# Patient Record
Sex: Female | Born: 1935 | Race: White | Hispanic: No | Smoking: Never smoker
Health system: Southern US, Community
[De-identification: ages and names within clinical notes are randomized; demographics above are authoritative.]

## PROBLEM LIST (undated history)

## (undated) DIAGNOSIS — R079 Chest pain, unspecified: Secondary | ICD-10-CM

## (undated) DIAGNOSIS — G4700 Insomnia, unspecified: Secondary | ICD-10-CM

## (undated) DIAGNOSIS — F0390 Unspecified dementia without behavioral disturbance: Secondary | ICD-10-CM

## (undated) DIAGNOSIS — E785 Hyperlipidemia, unspecified: Secondary | ICD-10-CM

## (undated) DIAGNOSIS — E559 Vitamin D deficiency, unspecified: Secondary | ICD-10-CM

## (undated) DIAGNOSIS — K635 Polyp of colon: Secondary | ICD-10-CM

## (undated) HISTORY — DX: Hyperlipidemia, unspecified: E78.5

## (undated) HISTORY — PX: CATARACT EXTRACTION: SUR2

## (undated) HISTORY — DX: Vitamin D deficiency, unspecified: E55.9

## (undated) HISTORY — DX: Insomnia, unspecified: G47.00

## (undated) HISTORY — DX: Unspecified dementia, unspecified severity, without behavioral disturbance, psychotic disturbance, mood disturbance, and anxiety: F03.90

## (undated) HISTORY — DX: Chest pain, unspecified: R07.9

## (undated) HISTORY — DX: Polyp of colon: K63.5

## (undated) HISTORY — PX: EYE SURGERY: SHX253

---

## 2017-08-11 ENCOUNTER — Other Ambulatory Visit: Payer: Self-pay | Admitting: Family Medicine

## 2017-08-11 ENCOUNTER — Encounter: Payer: Self-pay | Admitting: Gastroenterology

## 2017-08-11 DIAGNOSIS — Z1231 Encounter for screening mammogram for malignant neoplasm of breast: Secondary | ICD-10-CM

## 2017-08-11 DIAGNOSIS — E2839 Other primary ovarian failure: Secondary | ICD-10-CM

## 2017-08-17 ENCOUNTER — Observation Stay (HOSPITAL_COMMUNITY)
Admission: EM | Admit: 2017-08-17 | Discharge: 2017-08-19 | Disposition: A | Discharge status: Skilled Nursing Facility | Payer: Medicare Other | Attending: Internal Medicine | Admitting: Internal Medicine

## 2017-08-17 ENCOUNTER — Encounter (HOSPITAL_COMMUNITY): Payer: Self-pay | Admitting: Emergency Medicine

## 2017-08-17 ENCOUNTER — Emergency Department (HOSPITAL_COMMUNITY): Payer: Medicare Other

## 2017-08-17 DIAGNOSIS — R531 Weakness: Principal | ICD-10-CM

## 2017-08-17 DIAGNOSIS — D649 Anemia, unspecified: Secondary | ICD-10-CM | POA: Diagnosis present

## 2017-08-17 DIAGNOSIS — R410 Disorientation, unspecified: Secondary | ICD-10-CM

## 2017-08-17 DIAGNOSIS — W19XXXA Unspecified fall, initial encounter: Secondary | ICD-10-CM

## 2017-08-17 DIAGNOSIS — I700 Atherosclerosis of aorta: Secondary | ICD-10-CM | POA: Diagnosis not present

## 2017-08-17 DIAGNOSIS — G9340 Encephalopathy, unspecified: Secondary | ICD-10-CM | POA: Diagnosis present
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
## 2017-08-18 ENCOUNTER — Emergency Department (HOSPITAL_COMMUNITY): Payer: Medicare Other

## 2017-08-18 ENCOUNTER — Observation Stay (HOSPITAL_COMMUNITY): Payer: Medicare Other

## 2017-08-18 ENCOUNTER — Encounter (HOSPITAL_COMMUNITY): Payer: Self-pay | Admitting: Internal Medicine

## 2017-08-18 DIAGNOSIS — G9340 Encephalopathy, unspecified: Secondary | ICD-10-CM | POA: Diagnosis not present

## 2017-08-18 DIAGNOSIS — R531 Weakness: Secondary | ICD-10-CM | POA: Diagnosis not present

## 2017-08-18 DIAGNOSIS — D649 Anemia, unspecified: Secondary | ICD-10-CM

## 2017-08-18 LAB — RETICULOCYTES
RBC.: 3.27 MIL/uL — ABNORMAL LOW (ref 3.87–5.11)
RETIC COUNT ABSOLUTE: 49.1 10*3/uL (ref 19.0–186.0)
RETIC CT PCT: 1.5 % (ref 0.4–3.1)

## 2017-08-18 LAB — CBC
HCT: 32.1 % — ABNORMAL LOW (ref 36.0–46.0)
HCT: 34.1 % — ABNORMAL LOW (ref 36.0–46.0)
Hemoglobin: 10.5 g/dL — ABNORMAL LOW (ref 12.0–15.0)
Hemoglobin: 11 g/dL — ABNORMAL LOW (ref 12.0–15.0)
MCH: 32.1 pg (ref 26.0–34.0)
MCH: 32.3 pg (ref 26.0–34.0)
MCHC: 32.3 g/dL (ref 30.0–36.0)
MCHC: 32.7 g/dL (ref 30.0–36.0)
MCV: 100 fL (ref 78.0–100.0)
MCV: 98.2 fL (ref 78.0–100.0)
PLATELETS: 205 10*3/uL (ref 150–400)
PLATELETS: 214 10*3/uL (ref 150–400)
RBC: 3.27 MIL/uL — AB (ref 3.87–5.11)
RBC: 3.41 MIL/uL — ABNORMAL LOW (ref 3.87–5.11)
RDW: 13.7 % (ref 11.5–15.5)
RDW: 13.9 % (ref 11.5–15.5)
WBC: 5.5 10*3/uL (ref 4.0–10.5)
WBC: 7.8 10*3/uL (ref 4.0–10.5)

## 2017-08-18 LAB — I-STAT CG4 LACTIC ACID, ED: Lactic Acid, Venous: 1.29 mmol/L (ref 0.5–1.9)

## 2017-08-18 LAB — COMPREHENSIVE METABOLIC PANEL
ALK PHOS: 77 U/L (ref 38–126)
ALT: 11 U/L — AB (ref 14–54)
AST: 17 U/L (ref 15–41)
Albumin: 3.9 g/dL (ref 3.5–5.0)
Anion gap: 8 (ref 5–15)
BILIRUBIN TOTAL: 0.1 mg/dL — AB (ref 0.3–1.2)
BUN: 19 mg/dL (ref 6–20)
CO2: 28 mmol/L (ref 22–32)
CREATININE: 0.64 mg/dL (ref 0.44–1.00)
Calcium: 10.7 mg/dL — ABNORMAL HIGH (ref 8.9–10.3)
Chloride: 105 mmol/L (ref 101–111)
GFR calc Af Amer: >60 mL/min (ref >60)
Glucose, Bld: 99 mg/dL (ref 65–99)
Potassium: 3.7 mmol/L (ref 3.5–5.1)
Sodium: 141 mmol/L (ref 135–145)
TOTAL PROTEIN: 7.4 g/dL (ref 6.5–8.1)

## 2017-08-18 LAB — URINALYSIS, ROUTINE W REFLEX MICROSCOPIC
Bacteria, UA: NONE SEEN
Bilirubin Urine: NEGATIVE
Glucose, UA: NEGATIVE mg/dL
HGB URINE DIPSTICK: NEGATIVE
Ketones, ur: NEGATIVE mg/dL
Nitrite: NEGATIVE
Protein, ur: NEGATIVE mg/dL
SPECIFIC GRAVITY, URINE: 1.013 (ref 1.005–1.030)
pH: 6 (ref 5.0–8.0)

## 2017-08-18 LAB — I-STAT TROPONIN, ED: Troponin i, poc: 0.01 ng/mL (ref 0.00–0.08)

## 2017-08-18 LAB — CREATININE, SERUM
Creatinine, Ser: 0.64 mg/dL (ref 0.44–1.00)
GFR calc non Af Amer: >60 mL/min (ref >60)

## 2017-08-18 LAB — FOLATE: Folate: 7.4 ng/mL (ref >5.9)

## 2017-08-18 LAB — CORTISOL: Cortisol, Plasma: 8.7 ug/dL

## 2017-08-18 LAB — FERRITIN: FERRITIN: 80 ng/mL (ref 11–307)

## 2017-08-18 LAB — VITAMIN B12: Vitamin B-12: 207 pg/mL (ref 180–914)

## 2017-08-18 LAB — AMMONIA: Ammonia: 17 umol/L (ref 9–35)

## 2017-08-18 LAB — TSH: TSH: 2.063 u[IU]/mL (ref 0.350–4.500)

## 2017-08-18 LAB — IRON AND TIBC
Iron: 84 ug/dL (ref 28–170)
Saturation Ratios: 33 % — ABNORMAL HIGH (ref 10.4–31.8)
TIBC: 258 ug/dL (ref 250–450)
UIBC: 174 ug/dL

## 2017-08-18 LAB — CK: Total CK: 31 U/L — ABNORMAL LOW (ref 38–234)

## 2017-08-18 LAB — RPR: RPR Ser Ql: NONREACTIVE

## 2017-08-18 LAB — TROPONIN I: Troponin I: <0.03 ng/mL (ref <0.03)

## 2017-08-18 MED ORDER — DOCUSATE SODIUM 100 MG PO CAPS
100.0000 mg | ORAL_CAPSULE | Freq: Every day | ORAL | Status: DC
  Administered 2017-08-18 – 2017-08-19 (×2): 100 mg via ORAL
  Filled 2017-08-18 (×2): qty 1

## 2017-08-18 MED ORDER — ACETAMINOPHEN 325 MG PO TABS: 650.0000 mg | ORAL_TABLET | Freq: Four times a day (QID) | ORAL | Status: DC | PRN

## 2017-08-18 MED ORDER — LORAZEPAM 2 MG/ML IJ SOLN
0.5000 mg | Freq: Once | INTRAMUSCULAR | Status: AC
  Administered 2017-08-18: 0.5 mg via INTRAVENOUS
  Filled 2017-08-18: qty 1

## 2017-08-18 MED ORDER — ONDANSETRON HCL 4 MG/2ML IJ SOLN: 4.0000 mg | Freq: Four times a day (QID) | INTRAMUSCULAR | Status: DC | PRN

## 2017-08-18 MED ORDER — ONDANSETRON HCL 4 MG PO TABS: 4.0000 mg | ORAL_TABLET | Freq: Four times a day (QID) | ORAL | Status: DC | PRN

## 2017-08-18 MED ORDER — ENOXAPARIN SODIUM 40 MG/0.4ML ~~LOC~~ SOLN
40.0000 mg | SUBCUTANEOUS | Status: DC
  Administered 2017-08-18 – 2017-08-19 (×2): 40 mg via SUBCUTANEOUS
  Filled 2017-08-18 (×2): qty 0.4

## 2017-08-18 MED ORDER — ENSURE ENLIVE PO LIQD
237.0000 mL | Freq: Two times a day (BID) | ORAL | Status: DC
  Administered 2017-08-18 – 2017-08-19 (×4): 237 mL via ORAL

## 2017-08-18 MED ORDER — ACETAMINOPHEN 650 MG RE SUPP: 650.0000 mg | Freq: Four times a day (QID) | RECTAL | Status: DC | PRN

## 2017-08-18 NOTE — ED Notes (Addendum)
Call Patients sister Delray Alt with any updates or questions : [phone]

## 2017-08-18 NOTE — ED Notes (Signed)
Call report to Melissa [phone] at [phone]
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
## 2017-08-18 NOTE — Progress Notes (Signed)
Pt seen and examined at bedside, admitted after midnight, please see earlier admission note by Dr. Toniann Fail. Pt admitted for evaluation of progressive weakness. VS and blood work stable this AM. PT eval pending.   Debbora Presto, MD  [facility] Pager [phone]  If 7PM-7AM, please contact night-coverage [email or web address] Password [email or web address]

## 2017-08-18 NOTE — Progress Notes (Signed)
Initial Nutrition Assessment  DOCUMENTATION CODES:   Severe malnutrition in context of social or environmental circumstances  INTERVENTION:  Continue Ensure Enlive po BID, each supplement provides 350 kcal and 20 grams of protein  NUTRITION DIAGNOSIS:   Malnutrition (severe) related to social / environmental circumstances (lived alone/did not prepare meals) as evidenced by severe depletion of muscle mass, severe depletion of body fat.  GOAL:   Patient will meet greater than or equal to 90% of their needs  MONITOR:   PO intake, Supplement acceptance, Weight trends, I & O's  REASON FOR ASSESSMENT:   Malnutrition Screening Tool    ASSESSMENT:   Pt with no significant PMH presents with weakness and confusion.   Spoke with pt, pt's sister, and pt's niece at bedside. Per report pt has had good po intake since admission. Per chart pt consumed 100% of meal at 1110. Per pt's sister, pt lived alone and would not prepare meals for herself r/t fatigue. Pt recently moved and is now watched by family.   [name] sister and niece report pt has had 25 lb weight loss in < 1 month, 19% loss, significant for time frame. Family reports they do not know a UBW for pt as they have not seen her in a few years. Pt reports a UBW ~150 lbs.  Pt reports enjoying the Ensure supplement and is willing to continue.  RD discussed the importance of adequate nutrition and ways to incorporate more calories and protein in her diet.   Labs reviewed Medications reviewed; Colace  Nutrition focused physical exam completed. Findings include severe muscle depletion, severe fat depletion and unable to assess edema.   Diet Order:  Diet regular Room service appropriate? Yes; Fluid consistency: Thin  Skin:  Reviewed, no issues  Last BM:  08/16/17   Height:   Ht Readings from Last 1 Encounters:  08/18/17  (1.676 m)    Weight:   Wt Readings from Last 1 Encounters:  08/18/17 109 lb 5.6 oz (49.6 kg)     Ideal Body Weight:  59 kg  BMI:  Body mass index is 17.65 kg/m.  Estimated Nutritional Needs:   Kcal:  1480-1640  Protein:  70-85 grams  Fluid:  >/= 1.5 L/d  EDUCATION NEEDS:   Education needs addressed  Fransisca Kaufmann, MS, RDN, LDN 08/18/2017 2:05 PM

## 2017-08-18 NOTE — Progress Notes (Signed)
Pt premedicated for MRI. SRP,RN

## 2017-08-18 NOTE — Progress Notes (Signed)
MD notified to order Premed prior to MRI procedure. Order received. SRP, RN

## 2017-08-19 ENCOUNTER — Encounter (HOSPITAL_COMMUNITY): Payer: Self-pay

## 2017-08-19 DIAGNOSIS — R531 Weakness: Secondary | ICD-10-CM | POA: Diagnosis not present

## 2017-08-19 LAB — CBC
HCT: 32.3 % — ABNORMAL LOW (ref 36.0–46.0)
Hemoglobin: 10.4 g/dL — ABNORMAL LOW (ref 12.0–15.0)
MCH: 32 pg (ref 26.0–34.0)
MCHC: 32.2 g/dL (ref 30.0–36.0)
MCV: 99.4 fL (ref 78.0–100.0)
PLATELETS: 205 10*3/uL (ref 150–400)
RBC: 3.25 MIL/uL — ABNORMAL LOW (ref 3.87–5.11)
RDW: 14 % (ref 11.5–15.5)
WBC: 4.9 10*3/uL (ref 4.0–10.5)

## 2017-08-19 LAB — BASIC METABOLIC PANEL
Anion gap: 4 — ABNORMAL LOW (ref 5–15)
BUN: 14 mg/dL (ref 6–20)
CALCIUM: 10.5 mg/dL — AB (ref 8.9–10.3)
CHLORIDE: 105 mmol/L (ref 101–111)
CO2: 33 mmol/L — AB (ref 22–32)
CREATININE: 0.5 mg/dL (ref 0.44–1.00)
GFR calc non Af Amer: >60 mL/min (ref >60)
Glucose, Bld: 92 mg/dL (ref 65–99)
Potassium: 4.2 mmol/L (ref 3.5–5.1)
SODIUM: 142 mmol/L (ref 135–145)

## 2017-08-19 NOTE — Progress Notes (Signed)
Resumed care of this patient from prior nurse and agree with previous assessment.  

## 2017-08-19 NOTE — Clinical Social Work Placement (Signed)
Pt discharging today - will admit to [facility]. Report [phone] Pt will transport via EMS- completed medical necessity form and arranged transportation. [name] sister and niece at bedside- aware and agreeable to plan- going to facility to complete admission paperwork while pt awaits transportation.  All information provided to facility via the HUB- see below for placement details.   CLINICAL SOCIAL WORK PLACEMENT  NOTE  Date:  08/19/2017  Patient Details  Name: Tamara Velez MRN: [id number] Date of Birth: Jul 22, 1931  Clinical Social Work is seeking post-discharge placement for this patient at the Skilled  Nursing Facility level of care (*CSW will initial, date and re-position this form in  chart as items are completed):  Yes   Patient/family provided with [facility] Clinical Social Work Department's list of facilities offering this level of care within the geographic area requested by the patient (or if unable, by the patient's family).  Yes   Patient/family informed of their freedom to choose among providers that offer the needed level of care, that participate in Medicare, Medicaid or managed care program needed by the patient, have an available bed and are willing to accept the patient.  Yes   Patient/family informed of [facility]'s ownership interest in [facility] and [facility], as well as of the fact that they are under no obligation to receive care at these facilities.  PASRR submitted to EDS on 08/19/17     PASRR number received on 08/19/17     Existing PASRR number confirmed on       FL2 transmitted to all facilities in geographic area requested by pt/family on 08/19/17     FL2 transmitted to all facilities within larger geographic area on       Patient informed that his/her managed care company has contracts with or will negotiate with certain facilities, including the following:        Yes   Patient/family informed of bed offers received.  Patient  chooses bed at [facility]     Physician recommends and patient chooses bed at [facility]    Patient to be transferred to [facility] on 08/19/17.  Patient to be transferred to facility by       Patient family notified on 08/19/17 of transfer.  Name of family member notified:  sister [name]     PHYSICIAN       Additional Comment:    _______________________________________________ [name], LCSW 08/19/2017, 5:07 PM  [phone]
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
## 2017-08-19 NOTE — Care Management Obs Status (Signed)
MEDICARE OBSERVATION STATUS NOTIFICATION   Patient Details  Name: Tamara Velez MRN: [id number] Date of Birth: 1931-11-04   Medicare Observation Status Notification Given:  Yes    Geni Bers, RN 08/19/2017, 4:56 PM
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
## 2017-08-19 NOTE — Discharge Instructions (Signed)
Fall Prevention in Hospitals, Adult WHAT ARE SOME SAFETY TIPS FOR PREVENTING FALLS? If you or a loved one has to stay in the hospital, talk with the health care providers about the risk of falling. Find out which medicines or treatments can cause dizziness or affect balance. Make a plan with the health care providers to prevent falls. The plan may include these points:  Ask for help moving around, especially after surgery or when feeling unwell.  Have support available when getting up and moving around.  Wear nonskid footwear.  Use the safety straps on wheelchairs.  Ask for help to get objects that are out of reach.  Wear eyeglasses.  Remove all clutter from the floor and the sides of the bed.  Keep equipment and wires securely out of the way.  Keep the bed locked in the low position.  Keep the side rails up on the bed.  Keep the nurse call button within reach.  Keep the door open when no one else is in the room.  Have someone stay in the hospital with you or your loved one.  Ask for a bed alarm if you are not able to stay with your loved one who is at risk for getting up without help.  Ask if sleeping pills or other medicines that alter mental status are necessary. WHAT INCREASES THE RISK FOR FALLS? Certain conditions and treatments may increase a patient's risk for falls in a hospital. These include:  Being in an unfamiliar environment.  Being on bed rest.  Having surgery.  Taking certain medicines, such as sleeping pills.  Having tubes in place, such as IV lines or catheters. Additional risk factors for falls in a hospital include:  Having vision problems.  Having a change in thinking, feeling, or behavior (altered mental status).  Having trouble with balance.  Needing to use the toilet frequently.  Having fallen in the past three months.  Having low blood pressure when standing up quickly (orthostatic hypotension). WHAT DOES THE HOSPITAL STAFF DO TO HELP  PREVENT ME OR MY LOVED ONE FROM FALLING? Hospitals have systems in place to prevent falls and accidents. Talk with the hospital staff about:  Doing an assessment to discuss fall risks and create a personalized plan to prevent falls.  Checking in regularly to see if help is needed for moving around and to assess any changes in fall risk.  Knowing where the nurse call button is and how to use it. Use this to call a nursing care provider any time.  Keeping personal items within reach. This includes eyeglasses, phones, and other electronic devices.  Following general safety guidelines when moving around.  Keeping the area around the bed free from clutter.  Removing unnecessary equipment or tubes to reduce the risk of tripping.  Using safety equipment, such as:  Walkers, crutches, and other walking devices for support.  Safety rails on the bed.  Safety straps in the bed.  A bed that can be lowered and locked to prevent movement.  Handrails in the bathroom.  Nonskid socks and shoes.  Locking mechanisms to secure equipment in place.  Lifting and transfer equipment. WHAT CAN I DO TO HELP PREVENT A FALL?  Talk with health care providers about fall prevention.  Have a personalized fall prevention plan in place.  Do not try to move around if you feel off balance or ill.  Change position slowly.  Sit on the side of the bed before standing up.  Sit down and call   for help if you feel dizzy or unsteady when standing.  Keep the hospital room clear of clutter. WHEN SHOULD I ASK FOR HELP? Ask for help whenever you:  Are not sure if you are able to move around safely.  Feel dizzy or unsteady.  Are not comfortable helping your loved one move around or use the bathroom. If you or a loved one falls, tell the hospital staff. This is important. This information is not intended to replace advice given to you by your health care provider. Make sure you discuss any questions you have  with your health care provider. Document Released: 10/30/2000 Document Revised: 03/31/2016 Document Reviewed: 08/15/2015 Elsevier Interactive Patient Education  2017 Elsevier Inc.  

## 2017-08-19 NOTE — NC FL2 (Signed)
  [place] MEDICAID FL2 LEVEL OF CARE SCREENING TOOL     IDENTIFICATION  Patient Name: Mykelle Cockerell Birthdate: 04/10/1931 Sex: female Admission Date (Current Location): 08/17/2017  [facility] and [place] Number:  Producer, television/film/video and Address:  [facility],  [address] [place], [place] [address]      Provider Number: [id number]  Attending Physician Name and Address:  Dorothea Ogle, MD  Relative Name and Phone Number:       Current Level of Care: Hospital Recommended Level of Care: Skilled Nursing Facility Prior Approval Number:    Date Approved/Denied:   PASRR Number: [id number]  Discharge Plan: SNF    Current Diagnoses: Patient Active Problem List   Diagnosis Date Noted  . Generalized weakness 08/18/2017  . Normochromic normocytic anemia 08/18/2017  . Acute encephalopathy 08/18/2017  . Weakness 08/18/2017    Orientation RESPIRATION BLADDER Height & Weight     Self, Time, Situation, Place  O2 (2L) Incontinent Weight: 109 lb 5.6 oz (49.6 kg) Height:   (167.6 cm)  BEHAVIORAL SYMPTOMS/MOOD NEUROLOGICAL BOWEL NUTRITION STATUS      Continent Diet (regular diet, thin fluid consistency)  AMBULATORY STATUS COMMUNICATION OF NEEDS Skin   Extensive Assist Verbally Normal                       Personal Care Assistance Level of Assistance  Bathing, Feeding, Dressing Bathing Assistance: Limited assistance Feeding assistance: Independent Dressing Assistance: Maximum assistance     Functional Limitations Info  Sight, Hearing, Speech Sight Info: Adequate Hearing Info: Adequate Speech Info: Adequate    SPECIAL CARE FACTORS FREQUENCY  PT (By licensed PT), OT (By licensed OT)     PT Frequency: 5x OT Frequency: 5x            Contractures Contractures Info: Not present    Additional Factors Info  Code Status, Allergies Code Status Info: full code Allergies Info: penicillins           Current Medications (08/19/2017):  This is the  current hospital active medication list Current Facility-Administered Medications  Medication Dose Route Frequency Provider Last Rate Last Dose  . acetaminophen (TYLENOL) tablet 650 mg  650 mg Oral Q6H PRN Eduard Clos, MD       Or  . acetaminophen (TYLENOL) suppository 650 mg  650 mg Rectal Q6H PRN Eduard Clos, MD      . docusate sodium (COLACE) capsule 100 mg  100 mg Oral Daily Eduard Clos, MD   100 mg at 08/19/17 1022  . enoxaparin (LOVENOX) injection 40 mg  40 mg Subcutaneous Q24H Eduard Clos, MD   40 mg at 08/19/17 1023  . feeding supplement (ENSURE ENLIVE) (ENSURE ENLIVE) liquid 237 mL  237 mL Oral BID BM Dorothea Ogle, MD   237 mL at 08/19/17 1027  . ondansetron (ZOFRAN) tablet 4 mg  4 mg Oral Q6H PRN Eduard Clos, MD       Or  . ondansetron [facility]) injection 4 mg  4 mg Intravenous Q6H PRN Eduard Clos, MD         Discharge Medications: Please see discharge summary for a list of discharge medications.  Relevant Imaging Results:  Relevant Lab Results:   Additional Information SS#[id number]  [name], LCSW

## 2017-08-19 NOTE — Progress Notes (Signed)
Report given to RN at [place], Leavy Cella.

## 2017-08-19 NOTE — Progress Notes (Signed)
PTAR at bedside to transport pt to [facility]. All belongings sent with pt. VSS.
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
## 2017-08-20 ENCOUNTER — Other Ambulatory Visit: Payer: Self-pay | Admitting: Family Medicine

## 2017-08-20 DIAGNOSIS — R42 Dizziness and giddiness: Secondary | ICD-10-CM

## 2017-09-02 ENCOUNTER — Other Ambulatory Visit: Payer: Self-pay | Admitting: *Deleted

## 2017-09-02 ENCOUNTER — Encounter: Payer: Self-pay | Admitting: *Deleted

## 2017-09-03 ENCOUNTER — Ambulatory Visit: Payer: Medicare Other | Admitting: Emergency Medicine

## 2017-09-08 ENCOUNTER — Ambulatory Visit: Payer: Self-pay

## 2017-09-08 ENCOUNTER — Other Ambulatory Visit: Payer: Self-pay

## 2017-10-05 ENCOUNTER — Telehealth: Payer: Self-pay | Admitting: Gastroenterology

## 2017-10-05 ENCOUNTER — Ambulatory Visit: Payer: Self-pay | Admitting: Gastroenterology

## 2017-10-05 NOTE — Telephone Encounter (Signed)
No charge this time. 

## 2017-10-05 NOTE — Telephone Encounter (Signed)
Do you want to charge? 

## 2017-10-06 ENCOUNTER — Inpatient Hospital Stay
Admission: RE | Admit: 2017-10-06 | Discharge: 2017-10-06 | Disposition: A | Discharge status: Home / Self Care | Payer: Medicare Other | Source: Ambulatory Visit | Attending: Family Medicine | Admitting: Family Medicine

## 2017-10-06 ENCOUNTER — Ambulatory Visit: Payer: Medicare Other

## 2017-10-28 ENCOUNTER — Institutional Professional Consult (permissible substitution): Payer: Medicare Other | Admitting: Emergency Medicine

## 2017-11-05 ENCOUNTER — Ambulatory Visit: Payer: Medicare Other

## 2017-11-05 ENCOUNTER — Inpatient Hospital Stay: Admission: RE | Admit: 2017-11-05 | Payer: Medicare Other | Source: Ambulatory Visit

## 2017-11-23 ENCOUNTER — Institutional Professional Consult (permissible substitution): Payer: Medicare Other | Admitting: Emergency Medicine

## 2017-11-25 ENCOUNTER — Ambulatory Visit
Admission: RE | Admit: 2017-11-25 | Discharge: 2017-11-25 | Disposition: A | Discharge status: Home / Self Care | Payer: Medicare Other | Source: Ambulatory Visit | Attending: Family Medicine | Admitting: Family Medicine

## 2017-11-25 DIAGNOSIS — Z1231 Encounter for screening mammogram for malignant neoplasm of breast: Secondary | ICD-10-CM

## 2017-11-29 ENCOUNTER — Ambulatory Visit: Payer: Medicare Other | Admitting: Gastroenterology

## 2017-11-29 ENCOUNTER — Encounter: Payer: Self-pay | Admitting: Gastroenterology

## 2017-11-29 VITALS — BP 122/84 | Ht 68.0 in | Wt 121.5 lb

## 2017-11-29 DIAGNOSIS — K5904 Chronic idiopathic constipation: Secondary | ICD-10-CM | POA: Diagnosis not present

## 2017-11-29 DIAGNOSIS — Z800 Family history of malignant neoplasm of digestive organs: Secondary | ICD-10-CM | POA: Diagnosis not present

## 2017-11-29 DIAGNOSIS — D539 Nutritional anemia, unspecified: Secondary | ICD-10-CM | POA: Diagnosis not present

## 2017-11-29 NOTE — Progress Notes (Addendum)
History of Present Illness: This is an 84 year old female referred by [name], FNP for the evaluation of a family history of colon cancer and constipation.  She is accompanied by her sister who provides some of the history.  Patient has memory deficits.  Patient had 2 brothers who developed colon cancer at ages 67 and 34.  She has had several colonoscopies over the years but she relates the last colonoscopy was probably over 10 years ago.  Colonoscopies were performed in the [place] area.  She does not recall the name of her physicians.  She relates that one polyp was removed at 1 colonoscopy but she does not recall any other significant findings.  She had problems with constipation a few months ago but began taking more fiber in her diet with daily prunes and her constipation resolved.  A mild macrocytic anemia was noted by her PCP however I do not have results of any further evaluation today. Hb+11.5, MCV=103.  The patient's sister relates that the patient has had problems with shortness of breath and oxygen desaturation into the 80s has been noted upon ambulation.  She has an appointment with [facility] in the next few weeks.  Denies weight loss, abdominal pain, diarrhea, change in stool caliber, melena, hematochezia, nausea, vomiting, dysphagia, reflux symptoms, chest pain.     Allergies  Allergen Reactions  . Penicillins    Outpatient Medications Prior to Visit  Medication Sig Dispense Refill  . Ferrous Sulfate Dried (FERROUS SULFATE CR PO) Take 65 Units by mouth daily.    . LUTEIN PO Take by mouth daily.    . memantine (NAMENDA) 5 MG tablet Take 5 mg by mouth 2 (two) times daily.    . Vitamin D, Ergocalciferol, (DRISDOL) 50000 units CAPS capsule Take 50,000 Units by mouth every 7 (seven) days.  3   No facility-administered medications prior to visit.    Past Medical History:  Diagnosis Date  . Colon polyps    benign per pt. removed years ago  . Vitamin D  deficiency    Past Surgical History:  Procedure Laterality Date  . CATARACT EXTRACTION Bilateral   . EYE SURGERY     "leaking vessels"   Social History   Socioeconomic History  . Marital status: Widowed    Spouse name: None  . Number of children: None  . Years of education: None  . Highest education level: None  Social Needs  . Financial resource strain: None  . Food insecurity - worry: None  . Food insecurity - inability: None  . Transportation needs - medical: None  . Transportation needs - non-medical: None  Occupational History  . None  Tobacco Use  . Smoking status: Never Smoker  . Smokeless tobacco: Never Used  Substance and Sexual Activity  . Alcohol use: No  . Drug use: No  . Sexual activity: No  Other Topics Concern  . None  Social History Narrative  . None   Family History  Problem Relation Age of Onset  . Breast cancer Mother   . Breast cancer Sister   . Colon cancer Brother   . Colon cancer Brother       Review of Systems: Pertinent positive and negative review of systems were noted in the above HPI section. All other review of systems were otherwise negative.    Physical Exam: General: Well developed, well nourished, frail, elderly, no acute distress Head: Normocephalic and atraumatic Eyes:  sclerae anicteric, EOMI Ears: Normal auditory  acuity Mouth: No deformity or lesions Neck: Supple, no masses or thyromegaly Lungs: Clear throughout to auscultation Heart: Regular rate and rhythm; no murmurs, rubs or bruits Abdomen: Soft, non tender and non distended. No masses, hepatosplenomegaly or hernias noted. Normal Bowel sounds Rectal: not done Musculoskeletal: Symmetrical with no gross deformities  Skin: No lesions on visible extremities Pulses:  Normal pulses noted Extremities: No clubbing, cyanosis, edema or deformities noted Neurological: Alert oriented x 4, shuffling and unsteady gait, memory deficits.   Cervical Nodes:  No significant  cervical adenopathy Inguinal Nodes: No significant inguinal adenopathy Psychological:  Alert and cooperative. Normal mood and affect  Assessment and Recommendations:  1. Strong family history of colon cancer, personal history of a colon polyp.  Given her age, frail condition, shortness of breath with desaturation upon ambulation, she is not a good candidate for an elective colonoscopy at this time.  She will contact us after her pulmonary evaluation to further discuss.  2. Mild constipation.  Continue high-fiber diet with daily prunes and adequate daily water intake.  3. Mild macrocytic anemia.  B12, folate and iron studies unrevealing. Follow up with PCP.    cc: Virl AxeKartaoui, [place], FNP [address] [place], [place] [id number]

## 2017-11-29 NOTE — Patient Instructions (Signed)
Thank you for choosing me and [facility].  Malcolm T. Stark, Jr., MD., FACG  

## 2017-12-03 ENCOUNTER — Ambulatory Visit
Admission: RE | Admit: 2017-12-03 | Discharge: 2017-12-03 | Disposition: A | Discharge status: Home / Self Care | Payer: Medicare Other | Source: Ambulatory Visit | Attending: Family Medicine | Admitting: Family Medicine

## 2017-12-03 DIAGNOSIS — E2839 Other primary ovarian failure: Secondary | ICD-10-CM

## 2017-12-10 ENCOUNTER — Ambulatory Visit: Payer: Medicare Other | Admitting: Emergency Medicine

## 2017-12-10 ENCOUNTER — Encounter: Payer: Self-pay | Admitting: Emergency Medicine

## 2017-12-10 VITALS — BP 133/68 | HR 72 | Ht 67.0 in | Wt 122.0 lb

## 2017-12-10 DIAGNOSIS — R0902 Hypoxemia: Secondary | ICD-10-CM

## 2017-12-10 MED ORDER — IPRATROPIUM BROMIDE 0.03 % NA SOLN: 2.0000 | Freq: Three times a day (TID) | NASAL | 12 refills | Status: DC

## 2017-12-10 NOTE — Patient Instructions (Addendum)
We confirmed today that your oxygen level drops slightly when you are up walking.  We can revisit in the future whether we should arrange for oxygen for you to use at home.  We will arrange for pulmonary function testing to better evaluate your lung function.  Please start ipratropium 0.03% nasal spray, 2 sprays each nostril 2-3 times daily when needed for nasal drainage Follow with Dr Delton CoombesByrum next available with PFT on the same day.

## 2017-12-10 NOTE — Assessment & Plan Note (Signed)
Etiology unclear but we were able to reproduce this on ambulation today in the office.  Does not have any of the typical risk factors, i.e. she is not obese, she has no history of tobacco use or underlying lung disease.  I question whether her anemia may be a factor.  This is being treated with iron supplementation.  A chest x-ray done in October 2018 was unremarkable.  I think we should start to work up with pulmonary function testing, I expand to echocardiogram or VQ scan depending on results.  I discussed initiation of oxygen with her today.  She is resistant to this but would probably do it if we felt it was absolutely necessary.  She would like to defer until we have started the workup.  I believe she hopes that it may be possible to avoid.  She may be correct if the anemia is the issue since we are trying to address this.

## 2017-12-10 NOTE — Progress Notes (Signed)
Subjective:    Patient ID: Tamara SnideSusie Velez, female    DOB: 1931/05/14, 84 y.o.   MRN: [id number]  HPI 84 year old never smoker with a history of benign colon polyps, vitamin D deficiency, normocytic normochromic anemia, possible component of dementia.  Recently started on Vit D and Fe. Admitted for generalized weakness in October 2018. [place] recently moved from [place]. Has been in [place], now has moved in with her sister. She gets home health, was noted to have a desaturation to 80's with ambulation. Sometimes associated with lightheadedness, leg weakness. She feels a dryness in her throat, a fullness in her throat. Denies any CP. She does have some imbalance. She has a lot of clear PND, no better w loratadine or NS.   CXR 08/17/17 > normal.   Review of Systems  HENT: Positive for postnasal drip and rhinorrhea.   Respiratory: Negative for shortness of breath.        Hypoxemia  Cardiovascular: Negative for chest pain.    Past Medical History:  Diagnosis Date  . Colon polyps    benign per pt. removed years ago  . Vitamin D deficiency      Family History  Problem Relation Age of Onset  . Breast cancer Mother   . Breast cancer Sister   . Colon cancer Brother   . Colon cancer Brother      Social History   Socioeconomic History  . Marital status: Widowed    Spouse name: Not on file  . Number of children: Not on file  . Years of education: Not on file  . Highest education level: Not on file  Social Needs  . Financial resource strain: Not on file  . Food insecurity - worry: Not on file  . Food insecurity - inability: Not on file  . Transportation needs - medical: Not on file  . Transportation needs - non-medical: Not on file  Occupational History  . Not on file  Tobacco Use  . Smoking status: Never Smoker  . Smokeless tobacco: Never Used  Substance and Sexual Activity  . Alcohol use: No  . Drug use: No  . Sexual activity: No  Other Topics Concern  . Not on file    Social History Narrative  . Not on file  she worked on a farm, Public librariantelephone operator.   Allergies  Allergen Reactions  . Penicillins      Outpatient Medications Prior to Visit  Medication Sig Dispense Refill  . Ferrous Sulfate Dried (FERROUS SULFATE CR PO) Take 65 Units by mouth daily.    . LUTEIN PO Take by mouth daily.    . memantine (NAMENDA) 5 MG tablet Take 5 mg by mouth 2 (two) times daily.    . Vitamin D, Ergocalciferol, (DRISDOL) 50000 units CAPS capsule Take 50,000 Units by mouth every 7 (seven) days.  3   No facility-administered medications prior to visit.         Objective:   Physical Exam Vitals:   12/10/17 1611  BP: 133/68  Pulse: 72  SpO2: 94%  Weight: 122 lb (55.3 kg)  Height: 5\' 7"  (1.702 m)   Gen: Pleasant, thin elderly woman, in no distress,  normal affect  ENT: No lesions,  mouth clear,  oropharynx clear, no postnasal drip  Neck: No JVD, no stridor  Lungs: No use of accessory muscles, clear bilaterally, no wheezes, no crackles  Cardiovascular: RRR, heart sounds normal, no murmur or gallops, no peripheral edema  Musculoskeletal: No deformities, no cyanosis or  clubbing  Neuro: alert, a bit forgetful, oriented to self and place, nonfocal  Skin: Warm, no lesions or rashes         Assessment & Plan:  Exercise hypoxemia Etiology unclear but we were able to reproduce this on ambulation today in the office.  Does not have any of the typical risk factors, i.e. she is not obese, she has no history of tobacco use or underlying lung disease.  I question whether her anemia may be a factor.  This is being treated with iron supplementation.  A chest x-ray done in October 2018 was unremarkable.  I think we should start to work up with pulmonary function testing, I expand to echocardiogram or VQ scan depending on results.  I discussed initiation of oxygen with her today.  She is resistant to this but would probably do it if we felt it was absolutely necessary.   She would like to defer until we have started the workup.  I believe she hopes that it may be possible to avoid.  She may be correct if the anemia is the issue since we are trying to address this.  Levy Pupa, MD, PhD 12/10/2017, 5:09 PM [facility] [phone] or if no answer [phone]

## 2018-01-12 ENCOUNTER — Ambulatory Visit: Payer: Medicare Other | Admitting: Emergency Medicine

## 2018-02-17 ENCOUNTER — Ambulatory Visit: Payer: Medicare Other | Admitting: Emergency Medicine

## 2018-02-21 ENCOUNTER — Ambulatory Visit: Payer: Medicare Other | Admitting: Neurology

## 2018-02-21 ENCOUNTER — Telehealth: Payer: Self-pay

## 2018-02-21 NOTE — Telephone Encounter (Signed)
Pt did not show for their appt with Dr. Athar today.  

## 2018-02-22 ENCOUNTER — Encounter: Payer: Self-pay | Admitting: Neurology

## 2018-03-02 ENCOUNTER — Encounter (HOSPITAL_COMMUNITY): Payer: Self-pay | Admitting: Emergency Medicine

## 2018-03-02 ENCOUNTER — Other Ambulatory Visit: Payer: Self-pay

## 2018-03-02 ENCOUNTER — Observation Stay (HOSPITAL_COMMUNITY)
Admission: EM | Admit: 2018-03-02 | Discharge: 2018-03-07 | Disposition: A | Discharge status: Skilled Nursing Facility | Payer: Medicare Other | Attending: Family Medicine | Admitting: Family Medicine

## 2018-03-02 ENCOUNTER — Emergency Department (HOSPITAL_COMMUNITY): Payer: Medicare Other

## 2018-03-02 DIAGNOSIS — M25551 Pain in right hip: Secondary | ICD-10-CM | POA: Diagnosis not present

## 2018-03-02 DIAGNOSIS — I6529 Occlusion and stenosis of unspecified carotid artery: Secondary | ICD-10-CM | POA: Diagnosis not present

## 2018-03-02 DIAGNOSIS — M25561 Pain in right knee: Secondary | ICD-10-CM

## 2018-03-02 DIAGNOSIS — M1611 Unilateral primary osteoarthritis, right hip: Secondary | ICD-10-CM | POA: Insufficient documentation

## 2018-03-02 DIAGNOSIS — Z880 Allergy status to penicillin: Secondary | ICD-10-CM | POA: Insufficient documentation

## 2018-03-02 DIAGNOSIS — W19XXXA Unspecified fall, initial encounter: Secondary | ICD-10-CM

## 2018-03-02 DIAGNOSIS — M5126 Other intervertebral disc displacement, lumbar region: Secondary | ICD-10-CM | POA: Diagnosis not present

## 2018-03-02 DIAGNOSIS — R531 Weakness: Secondary | ICD-10-CM

## 2018-03-02 DIAGNOSIS — Y92013 Bedroom of single-family (private) house as the place of occurrence of the external cause: Secondary | ICD-10-CM | POA: Insufficient documentation

## 2018-03-02 DIAGNOSIS — M11261 Other chondrocalcinosis, right knee: Secondary | ICD-10-CM | POA: Insufficient documentation

## 2018-03-02 DIAGNOSIS — M6281 Muscle weakness (generalized): Secondary | ICD-10-CM | POA: Diagnosis not present

## 2018-03-02 DIAGNOSIS — W1839XA Other fall on same level, initial encounter: Secondary | ICD-10-CM | POA: Diagnosis not present

## 2018-03-02 DIAGNOSIS — J188 Other pneumonia, unspecified organism: Secondary | ICD-10-CM | POA: Insufficient documentation

## 2018-03-02 DIAGNOSIS — R918 Other nonspecific abnormal finding of lung field: Secondary | ICD-10-CM | POA: Diagnosis not present

## 2018-03-02 DIAGNOSIS — F0390 Unspecified dementia without behavioral disturbance: Secondary | ICD-10-CM | POA: Diagnosis not present

## 2018-03-02 DIAGNOSIS — R0989 Other specified symptoms and signs involving the circulatory and respiratory systems: Secondary | ICD-10-CM | POA: Diagnosis not present

## 2018-03-02 DIAGNOSIS — R8290 Unspecified abnormal findings in urine: Secondary | ICD-10-CM

## 2018-03-02 DIAGNOSIS — M8580 Other specified disorders of bone density and structure, unspecified site: Secondary | ICD-10-CM | POA: Insufficient documentation

## 2018-03-02 DIAGNOSIS — Z79899 Other long term (current) drug therapy: Secondary | ICD-10-CM | POA: Insufficient documentation

## 2018-03-02 DIAGNOSIS — Y92009 Unspecified place in unspecified non-institutional (private) residence as the place of occurrence of the external cause: Secondary | ICD-10-CM

## 2018-03-02 DIAGNOSIS — R0602 Shortness of breath: Secondary | ICD-10-CM | POA: Insufficient documentation

## 2018-03-02 DIAGNOSIS — Y9301 Activity, walking, marching and hiking: Secondary | ICD-10-CM | POA: Diagnosis not present

## 2018-03-02 LAB — CBC WITH DIFFERENTIAL/PLATELET
BASOS ABS: 0 10*3/uL (ref 0.0–0.1)
Basophils Relative: 0 %
Eosinophils Absolute: 0 10*3/uL (ref 0.0–0.7)
Eosinophils Relative: 0 %
HEMATOCRIT: 37.6 % (ref 36.0–46.0)
HEMOGLOBIN: 12.4 g/dL (ref 12.0–15.0)
LYMPHS PCT: 9 %
Lymphs Abs: 1 10*3/uL (ref 0.7–4.0)
MCH: 33.2 pg (ref 26.0–34.0)
MCHC: 33 g/dL (ref 30.0–36.0)
MCV: 100.8 fL — AB (ref 78.0–100.0)
Monocytes Absolute: 0.8 10*3/uL (ref 0.1–1.0)
Monocytes Relative: 7 %
NEUTROS ABS: 10.1 10*3/uL — AB (ref 1.7–7.7)
NEUTROS PCT: 84 %
PLATELETS: 186 10*3/uL (ref 150–400)
RBC: 3.73 MIL/uL — AB (ref 3.87–5.11)
RDW: 12.4 % (ref 11.5–15.5)
WBC: 12 10*3/uL — AB (ref 4.0–10.5)

## 2018-03-02 LAB — BASIC METABOLIC PANEL
ANION GAP: 9 (ref 5–15)
BUN: 19 mg/dL (ref 6–20)
CHLORIDE: 106 mmol/L (ref 101–111)
CO2: 26 mmol/L (ref 22–32)
Calcium: 10.5 mg/dL — ABNORMAL HIGH (ref 8.9–10.3)
Creatinine, Ser: 0.79 mg/dL (ref 0.44–1.00)
GFR calc Af Amer: >60 mL/min (ref >60)
GLUCOSE: 107 mg/dL — AB (ref 65–99)
POTASSIUM: 3.8 mmol/L (ref 3.5–5.1)
Sodium: 141 mmol/L (ref 135–145)

## 2018-03-02 NOTE — ED Triage Notes (Signed)
Pt arriving via EMS from home with right knee pain. Pt reports she was using her walker, felt her legs give out and slid down to the floor. Pt denies neck or back pain. Pt does have early dementia per family report. Pt able to move extremities without difficulty.

## 2018-03-02 NOTE — ED Provider Notes (Signed)
[facility]-EMERGENCY DEPT Provider Note   CSN: [id number] Arrival date & time: 03/02/18  2046     History   Chief Complaint Chief Complaint  Patient presents with  . Knee Pain    HPI Tamara Velez is a 84 y.o. female.  The history is provided by the patient and medical records.  Knee Pain      84 y.o. F with hx of dementia, vit D Defiency, insomnia, HLP, presenting to the ED after a fall.  Patient states she had gotten up from bed to use the restroom and was making her way back to her bed with her walker which is baseline.  States she walked around the other side of the bed to adjust the blanket and just got very weak feeling and slid to the floor.  States her legs started to feel like jello and she just fell to the floor.  Denies head injury or LOC.  She did briefly get wedged between bed and dresser but was able to be assisted up by her daughter.  States now she feels sore all over but does have pain in the right knee.  Daughter states she has been weak for about 3 days now.  She was diagnosed with CAP over the weekend at urgent care.  She was started on abx for this.  Denies any chest pain or shortness of breath.  No neck/back pain.  No numbness/weakness.  No incontinence.  Past Medical History:  Diagnosis Date  . Chest pain   . Colon polyps    benign per pt. removed years ago  . Dementia   . Hyperlipidemia   . Insomnia   . Vitamin D deficiency   . Vitamin D deficiency     Patient Active Problem List   Diagnosis Date Noted  . Exercise hypoxemia 12/10/2017  . Generalized weakness 08/18/2017  . Normochromic normocytic anemia 08/18/2017  . Acute encephalopathy 08/18/2017  . Weakness 08/18/2017    Past Surgical History:  Procedure Laterality Date  . CATARACT EXTRACTION Bilateral   . EYE SURGERY     "leaking vessels"     OB History    Gravida  0   Para  0   Term  0   Preterm  0   AB  0   Living  0     SAB  0   TAB  0   Ectopic    0   Multiple  0   Live Births  0            Home Medications    Prior to Admission medications   Medication Sig Start Date End Date Taking? Authorizing Provider  Ferrous Sulfate Dried (FERROUS SULFATE CR PO) Take 65 Units by mouth daily.    [provider]  ipratropium (ATROVENT) 0.03 % nasal spray Place 2 sprays into both nostrils 3 (three) times daily. 12/10/17   Leslye Peer, MD  LUTEIN PO Take by mouth daily.    [provider]  Melatonin 3 MG TABS Take by mouth at bedtime.    [provider]  memantine (NAMENDA) 5 MG tablet Take 5 mg by mouth 2 (two) times daily.    [provider]  Vitamin D, Ergocalciferol, (DRISDOL) 50000 units CAPS capsule Take 50,000 Units by mouth every 7 (seven) days. 08/10/17   [provider]    Family History Family History  Problem Relation Age of Onset  . Breast cancer Mother   . Breast cancer  Sister   . Colon cancer Brother   . Colon cancer Brother     Social History Social History   Tobacco Use  . Smoking status: Never Smoker  . Smokeless tobacco: Never Used  Substance Use Topics  . Alcohol use: No  . Drug use: No     Allergies   Penicillins   Review of Systems Review of Systems  Musculoskeletal: Positive for arthralgias.  Neurological: Positive for weakness (generalized).  All other systems reviewed and are negative.    Physical Exam Updated Vital Signs BP 116/72 (BP Location: Left Arm)   Pulse 73   Resp 14   Ht 5\' 5"  (1.651 m)   Wt 50.8 kg (112 lb)   SpO2 90%   BMI 18.64 kg/m   Physical Exam  Constitutional: She is oriented to person, place, and time. She appears well-developed and well-nourished.  HENT:  Head: Normocephalic and atraumatic.  Mouth/Throat: Oropharynx is clear and moist.  No visible signs of head trauma  Eyes: Pupils are equal, round, and reactive to light. Conjunctivae and EOM are normal.  Neck: Normal range of motion.  Cardiovascular: Normal  rate, regular rhythm and normal heart sounds.  Pulmonary/Chest: Effort normal. No stridor. No respiratory distress. She has wheezes.  Faint wheeze left lung fields, no acute distress, speaking in paragraphs without issue  Abdominal: Soft. Bowel sounds are normal. There is no tenderness. There is no rebound.  Musculoskeletal: Normal range of motion.  Mild swelling of the right knee diffusely, no gross deformities, no apparent pain with ROM No leg shortening, pelvis stable and non-tender DP pulses intact bilaterally  Neurological: She is alert and oriented to person, place, and time. She displays no tremor. No cranial nerve deficit or sensory deficit. She exhibits normal muscle tone. She displays no seizure activity.  Awake, alert, moving arms and legs well, no apparent strength/motor deficit, speech is clear  Skin: Skin is warm and dry.  Psychiatric: She has a normal mood and affect.  Nursing note and vitals reviewed.    ED Treatments / Results  Labs (all labs ordered are listed, but only abnormal results are displayed) Labs Reviewed  CBC WITH DIFFERENTIAL/PLATELET - Abnormal; Notable for the following components:      Result Value   WBC 12.0 (*)    RBC 3.73 (*)    MCV 100.8 (*)    Neutro Abs 10.1 (*)    All other components within normal limits  BASIC METABOLIC PANEL - Abnormal; Notable for the following components:   Glucose, Bld 107 (*)    Calcium 10.5 (*)    All other components within normal limits  URINALYSIS, ROUTINE W REFLEX MICROSCOPIC - Abnormal; Notable for the following components:   APPearance HAZY (*)    Hgb urine dipstick SMALL (*)    Protein, ur 30 (*)    Leukocytes, UA TRACE (*)    Bacteria, UA RARE (*)    Squamous Epithelial / LPF 0-5 (*)    All other components within normal limits  URINE CULTURE    EKG None  Radiology Dg Chest 2 View  Result Date: 03/02/2018 CLINICAL DATA:  Short of breath EXAM: CHEST - 2 VIEW COMPARISON:  08/17/2017 FINDINGS: Normal  mediastinum and cardiac silhouette. Chronic central bronchitic markings. Normal pulmonary vasculature. No effusion, infiltrate, or pneumothorax. IMPRESSION: Bronchitic change.  No acute findings.  Hyperinflated lungs. Electronically Signed   By: Genevive Bi M.D.   On: 03/02/2018 22:53   Ct Head Wo Contrast  Result  Date: 03/03/2018 CLINICAL DATA:  Muscle weakness resulting in fall.  Dementia. EXAM: CT HEAD WITHOUT CONTRAST TECHNIQUE: Contiguous axial images were obtained from the base of the skull through the vertex without intravenous contrast. COMPARISON:  08/18/2017 FINDINGS: BRAIN: No intraparenchymal hemorrhage, mass effect nor midline shift. Periventricular and subcortical white matter hypodensities consistent with mild-to-moderate chronic small vessel ischemic disease is identified. No acute large vascular territory infarcts. No abnormal extra-axial fluid collections. Basal cisterns are not effaced and midline. VASCULAR: Moderate calcific atherosclerosis of the carotid siphons. SKULL: No skull fracture. No significant scalp soft tissue swelling. SINUSES/ORBITS: The mastoid air-cells are clear. The included paranasal sinuses are well-aerated.The included ocular globes and orbital contents are non-suspicious. OTHER: None. IMPRESSION: Chronic mild-to-moderate microvascular ischemic disease of the brain. No acute intracranial abnormality. Electronically Signed   By: Tollie Ethavid  Kwon M.D.   On: 03/03/2018 03:45   Dg Knee Complete 4 Views Right  Result Date: 03/02/2018 CLINICAL DATA:  Patient fell at her home this pm and states she was unable to get up, states she is duzzy and a little short of breat, knee pain patella and medial side EXAM: RIGHT KNEE - COMPLETE 4+ VIEW COMPARISON:  None. FINDINGS: Chondrocalcinosis in the medial and lateral compartment. No acute fracture dislocation. Osteopenia noted IMPRESSION: 1. No fracture dislocation. 2. No joint effusion. 3. Osteopenia 4. Chondrocalcinosis.  Electronically Signed   By: Genevive BiStewart  Edmunds M.D.   On: 03/02/2018 22:46   Dg Hip Unilat With Pelvis 1v Right  Result Date: 03/03/2018 CLINICAL DATA:  Right hip pain after fall EXAM: DG HIP (WITH OR WITHOUT PELVIS) 1V RIGHT COMPARISON:  None. FINDINGS: There are skin fold artifacts projecting over the right intertrochanteric portion of the femur. This can potentially mask an underlying fracture. No joint dislocation is seen. There is lumbar spondylosis with syndesmophytes. No acute pelvic fracture. The right sacral ala is obscured by overlying bowel however. IMPRESSION: Limited study due to skin fold artifacts projecting over the intertrochanteric portion of the right femur. CT is therefore recommended to exclude occult fracture. Electronically Signed   By: Tollie Ethavid  Kwon M.D.   On: 03/03/2018 03:43    Procedures Procedures (including critical care time)  Medications Ordered in ED Medications - No data to display   Initial Impression / Assessment and Plan / ED Course  I have reviewed the triage vital signs and the nursing notes.  Pertinent labs & imaging results that were available during my care of the patient were reviewed by me and considered in my medical decision making (see chart for details).  84 year old female presenting to the ED with initially right knee pain.  After talking with daughter, it seems she has been suffering from some generalized weakness as well.  Recent diagnosis of pneumonia, still on antibiotics currently.  She is afebrile and nontoxic.  She has no significant signs of trauma on exam.  There is no reported head injury or loss of consciousness.  She is not currently on anticoagulation.  She seems at her neurologic baseline.  No large deficits noted here.  Pelvis is stable and nontender, no leg shortening.  Does report pain in the right knee, however no significant pain noted with range of motion, no tenderness to palpation.  Will obtain screening x-rays, basic labs.  1:36  AM Patient's workup is overall reassuring.  Mild leukocytosis, chest x-ray with some bronchitic changes but no focal consolidation.  Patient attempted to get up and go to the bathroom, however is too weak to ambulate  on her own.  Daughter has concerns about taking her home.  Reports this is her second fall in 2 days.  States she knows she is to weak to go back home.  She is open to the idea of temporary rehab placement for PT.  Will discuss with hospitalist for possible admission.  Dr. Selena Batten will admit for ongoing care.  PT to evaluate in AM.  Final Clinical Impressions(s) / ED Diagnoses   Final diagnoses:  Acute pain of right knee  Fall in home, initial encounter    ED Discharge Orders    None       Oletha Blend 03/03/18 1610    Raeford Razor, MD 03/03/18 1510

## 2018-03-02 NOTE — ED Notes (Signed)
Pt is aware a urine sample is needed. 

## 2018-03-03 ENCOUNTER — Inpatient Hospital Stay (HOSPITAL_COMMUNITY): Payer: Medicare Other

## 2018-03-03 DIAGNOSIS — M545 Low back pain, unspecified: Secondary | ICD-10-CM | POA: Insufficient documentation

## 2018-03-03 DIAGNOSIS — G8929 Other chronic pain: Secondary | ICD-10-CM | POA: Insufficient documentation

## 2018-03-03 DIAGNOSIS — R531 Weakness: Secondary | ICD-10-CM | POA: Diagnosis not present

## 2018-03-03 DIAGNOSIS — R8290 Unspecified abnormal findings in urine: Secondary | ICD-10-CM | POA: Diagnosis not present

## 2018-03-03 LAB — URINALYSIS, ROUTINE W REFLEX MICROSCOPIC
Bilirubin Urine: NEGATIVE
Glucose, UA: NEGATIVE mg/dL
Ketones, ur: NEGATIVE mg/dL
Nitrite: NEGATIVE
Protein, ur: 30 mg/dL — AB
SPECIFIC GRAVITY, URINE: 1.023 (ref 1.005–1.030)
pH: 5 (ref 5.0–8.0)

## 2018-03-03 LAB — CBC
HCT: 37.9 % (ref 36.0–46.0)
Hemoglobin: 12.5 g/dL (ref 12.0–15.0)
MCH: 33.1 pg (ref 26.0–34.0)
MCHC: 33 g/dL (ref 30.0–36.0)
MCV: 100.3 fL — AB (ref 78.0–100.0)
PLATELETS: 179 10*3/uL (ref 150–400)
RBC: 3.78 MIL/uL — ABNORMAL LOW (ref 3.87–5.11)
RDW: 12.3 % (ref 11.5–15.5)
WBC: 7.9 10*3/uL (ref 4.0–10.5)

## 2018-03-03 LAB — COMPREHENSIVE METABOLIC PANEL
ALT: 12 U/L — ABNORMAL LOW (ref 14–54)
AST: 19 U/L (ref 15–41)
Albumin: 3.7 g/dL (ref 3.5–5.0)
Alkaline Phosphatase: 67 U/L (ref 38–126)
Anion gap: 9 (ref 5–15)
BILIRUBIN TOTAL: 0.9 mg/dL (ref 0.3–1.2)
BUN: 10 mg/dL (ref 6–20)
CHLORIDE: 106 mmol/L (ref 101–111)
CO2: 28 mmol/L (ref 22–32)
Calcium: 10.2 mg/dL (ref 8.9–10.3)
Creatinine, Ser: 0.62 mg/dL (ref 0.44–1.00)
GFR calc Af Amer: >60 mL/min (ref >60)
Glucose, Bld: 101 mg/dL — ABNORMAL HIGH (ref 65–99)
Potassium: 3.8 mmol/L (ref 3.5–5.1)
Sodium: 143 mmol/L (ref 135–145)
Total Protein: 7.2 g/dL (ref 6.5–8.1)

## 2018-03-03 MED ORDER — MEMANTINE HCL 10 MG PO TABS
5.0000 mg | ORAL_TABLET | Freq: Two times a day (BID) | ORAL | Status: DC
  Administered 2018-03-04 – 2018-03-07 (×6): 5 mg via ORAL
  Filled 2018-03-03 (×6): qty 1
  Filled 2018-03-03: qty 0.5

## 2018-03-03 MED ORDER — ENOXAPARIN SODIUM 40 MG/0.4ML ~~LOC~~ SOLN
40.0000 mg | SUBCUTANEOUS | Status: DC
  Administered 2018-03-04 – 2018-03-06 (×3): 40 mg via SUBCUTANEOUS
  Filled 2018-03-03 (×3): qty 0.4

## 2018-03-03 MED ORDER — ACETAMINOPHEN 650 MG RE SUPP: 650.0000 mg | Freq: Four times a day (QID) | RECTAL | Status: DC | PRN

## 2018-03-03 MED ORDER — ACETAMINOPHEN 325 MG PO TABS: 650.0000 mg | ORAL_TABLET | Freq: Four times a day (QID) | ORAL | Status: DC | PRN

## 2018-03-03 MED ORDER — CIPROFLOXACIN IN D5W 400 MG/200ML IV SOLN
400.0000 mg | Freq: Two times a day (BID) | INTRAVENOUS | Status: DC
  Administered 2018-03-03 – 2018-03-04 (×3): 400 mg via INTRAVENOUS
  Filled 2018-03-03 (×3): qty 200

## 2018-03-03 MED ORDER — SODIUM CHLORIDE 0.9 % IV SOLN
INTRAVENOUS | Status: AC
  Administered 2018-03-03: 14:00:00 via INTRAVENOUS

## 2018-03-03 MED ORDER — VITAMIN D (ERGOCALCIFEROL) 1.25 MG (50000 UNIT) PO CAPS
50000.0000 [IU] | ORAL_CAPSULE | ORAL | Status: DC
  Administered 2018-03-04: 50000 [IU] via ORAL
  Filled 2018-03-03: qty 1

## 2018-03-03 MED ORDER — FERROUS SULFATE 325 (65 FE) MG PO TABS
325.0000 mg | ORAL_TABLET | Freq: Every day | ORAL | Status: DC
  Administered 2018-03-03 – 2018-03-07 (×5): 325 mg via ORAL
  Filled 2018-03-03 (×5): qty 1

## 2018-03-03 NOTE — ED Notes (Signed)
ED TO INPATIENT HANDOFF REPORT  Name/Age/Gender Tamara Velez 84 y.o. female  Code Status Code Status History    Date Active Date Inactive Code Status Order ID Comments User Context   08/18/2017 0556 08/20/2017 0059 Full Code [id number]  Rise Patience, MD ED      Home/SNF/Other Home  Chief Complaint Knee Pain  Level of Care/Admitting Diagnosis ED Disposition    ED Disposition Condition [facility]: [facility] [[id number]]  Level of Care: Telemetry [5]  Admit to tele based on following criteria: Monitor for Ischemic changes  Diagnosis: Weakness [607371]  Admitting Physician: Jani Gravel [[id number]]  Attending Physician: Jani Gravel [phone]  Estimated length of stay: past midnight tomorrow  Certification:: I certify this patient will need inpatient services for at least 2 midnights  PT Class (Do Not Modify): Inpatient [101]  PT Acc Code (Do Not Modify): Private [1]       Medical History Past Medical History:  Diagnosis Date  . Chest pain   . Colon polyps    benign per pt. removed years ago  . Dementia   . Hyperlipidemia   . Insomnia   . Vitamin D deficiency   . Vitamin D deficiency     Allergies Allergies  Allergen Reactions  . Penicillins Other (See Comments)    Has patient had a PCN reaction causing immediate rash, facial/tongue/throat swelling, SOB or lightheadedness with hypotension: Unknown Has patient had a PCN reaction causing severe rash involving mucus membranes or skin necrosis: No Has patient had a PCN reaction that required hospitalization: No Has patient had a PCN reaction occurring within the last 10 years: No If all of the above answers are "NO", then may proceed with Cephalosporin use.     IV Location/Drains/Wounds Patient Lines/Drains/Airways Status   Active Line/Drains/Airways    Name:   Placement date:   Placement time:   Site:   Days:   Peripheral IV 03/03/18 Left Forearm   03/03/18    0505    Forearm    less than 1          Labs/Imaging Results for orders placed or performed during the hospital encounter of 03/02/18 (from the past 48 hour(s))  CBC with Differential     Status: Abnormal   Collection Time: 03/02/18 10:57 PM  Result Value Ref Range   WBC 12.0 (H) 4.0 - 10.5 K/uL   RBC 3.73 (L) 3.87 - 5.11 MIL/uL   Hemoglobin 12.4 12.0 - 15.0 g/dL   HCT 37.6 36.0 - 46.0 %   MCV 100.8 (H) 78.0 - 100.0 fL   MCH 33.2 26.0 - 34.0 pg   MCHC 33.0 30.0 - 36.0 g/dL   RDW 12.4 11.5 - 15.5 %   Platelets 186 150 - 400 K/uL   Neutrophils Relative % 84 %   Neutro Abs 10.1 (H) 1.7 - 7.7 K/uL   Lymphocytes Relative 9 %   Lymphs Abs 1.0 0.7 - 4.0 K/uL   Monocytes Relative 7 %   Monocytes Absolute 0.8 0.1 - 1.0 K/uL   Eosinophils Relative 0 %   Eosinophils Absolute 0.0 0.0 - 0.7 K/uL   Basophils Relative 0 %   Basophils Absolute 0.0 0.0 - 0.1 K/uL    Comment: Performed at [facility], [place] [address], [place], [place] [address]  Basic metabolic panel     Status: Abnormal   Collection Time: 03/02/18 10:57 PM  Result Value Ref Range   Sodium 141  135 - 145 mmol/L   Potassium 3.8 3.5 - 5.1 mmol/L   Chloride 106 101 - 111 mmol/L   CO2 26 22 - 32 mmol/L   Glucose, Bld 107 (H) 65 - 99 mg/dL   BUN 19 6 - 20 mg/dL   Creatinine, Ser 0.79 0.44 - 1.00 mg/dL   Calcium 10.5 (H) 8.9 - 10.3 mg/dL   GFR calc non Af Amer >60 >60 mL/min   GFR calc Af Amer >60 >60 mL/min    Comment: (NOTE) The eGFR has been calculated using the CKD EPI equation. This calculation has not been validated in all clinical situations. eGFR's persistently <60 mL/min signify possible Chronic Kidney Disease.    Anion gap 9 5 - 15    Comment: Performed at [facility], [place] [address] [place], [place] [address]  Urinalysis, Routine w reflex microscopic     Status: Abnormal   Collection Time: 03/03/18 12:36 AM  Result Value Ref Range   Color, Urine YELLOW YELLOW   APPearance HAZY (A) CLEAR    Specific Gravity, Urine 1.023 1.005 - 1.030   pH 5.0 5.0 - 8.0   Glucose, UA NEGATIVE NEGATIVE mg/dL   Hgb urine dipstick SMALL (A) NEGATIVE   Bilirubin Urine NEGATIVE NEGATIVE   Ketones, ur NEGATIVE NEGATIVE mg/dL   Protein, ur 30 (A) NEGATIVE mg/dL   Nitrite NEGATIVE NEGATIVE   Leukocytes, UA TRACE (A) NEGATIVE   RBC / HPF 6-30 0 - 5 RBC/hpf   WBC, UA 6-30 0 - 5 WBC/hpf   Bacteria, UA RARE (A) NONE SEEN   Squamous Epithelial / LPF 0-5 (A) NONE SEEN   Mucus PRESENT    Ca Oxalate Crys, UA PRESENT     Comment: Performed at [facility], [place] [address] [place], [place] [address]   Dg Chest 2 View  Result Date: 03/02/2018 CLINICAL DATA:  Short of breath EXAM: CHEST - 2 VIEW COMPARISON:  08/17/2017 FINDINGS: Normal mediastinum and cardiac silhouette. Chronic central bronchitic markings. Normal pulmonary vasculature. No effusion, infiltrate, or pneumothorax. IMPRESSION: Bronchitic change.  No acute findings.  Hyperinflated lungs. Electronically Signed   By: Suzy Bouchard M.D.   On: 03/02/2018 22:53   Ct Head Wo Contrast  Result Date: 03/03/2018 CLINICAL DATA:  Muscle weakness resulting in fall.  Dementia. EXAM: CT HEAD WITHOUT CONTRAST TECHNIQUE: Contiguous axial images were obtained from the base of the skull through the vertex without intravenous contrast. COMPARISON:  08/18/2017 FINDINGS: BRAIN: No intraparenchymal hemorrhage, mass effect nor midline shift. Periventricular and subcortical white matter hypodensities consistent with mild-to-moderate chronic small vessel ischemic disease is identified. No acute large vascular territory infarcts. No abnormal extra-axial fluid collections. Basal cisterns are not effaced and midline. VASCULAR: Moderate calcific atherosclerosis of the carotid siphons. SKULL: No skull fracture. No significant scalp soft tissue swelling. SINUSES/ORBITS: The mastoid air-cells are clear. The included paranasal sinuses are well-aerated.The included  ocular globes and orbital contents are non-suspicious. OTHER: None. IMPRESSION: Chronic mild-to-moderate microvascular ischemic disease of the brain. No acute intracranial abnormality. Electronically Signed   By: Ashley Royalty M.D.   On: 03/03/2018 03:45   Dg Knee Complete 4 Views Right  Result Date: 03/02/2018 CLINICAL DATA:  Patient fell at her home this pm and states she was unable to get up, states she is duzzy and a little short of breat, knee pain patella and medial side EXAM: RIGHT KNEE - COMPLETE 4+ VIEW COMPARISON:  None. FINDINGS: Chondrocalcinosis in the medial and lateral compartment. No  acute fracture dislocation. Osteopenia noted IMPRESSION: 1. No fracture dislocation. 2. No joint effusion. 3. Osteopenia 4. Chondrocalcinosis. Electronically Signed   By: Suzy Bouchard M.D.   On: 03/02/2018 22:46   Dg Hip Unilat With Pelvis 1v Right  Result Date: 03/03/2018 CLINICAL DATA:  Right hip pain after fall EXAM: DG HIP (WITH OR WITHOUT PELVIS) 1V RIGHT COMPARISON:  None. FINDINGS: There are skin fold artifacts projecting over the right intertrochanteric portion of the femur. This can potentially mask an underlying fracture. No joint dislocation is seen. There is lumbar spondylosis with syndesmophytes. No acute pelvic fracture. The right sacral ala is obscured by overlying bowel however. IMPRESSION: Limited study due to skin fold artifacts projecting over the intertrochanteric portion of the right femur. CT is therefore recommended to exclude occult fracture. Electronically Signed   By: Ashley Royalty M.D.   On: 03/03/2018 03:43    Pending Labs Unresulted Labs (From admission, onward)   Start     Ordered   03/03/18 0158  Urine culture  Add-on,   STAT     03/03/18 0157   Signed and Held  Creatinine, serum  (enoxaparin (LOVENOX)    CrCl >/= 30 ml/min)  Weekly,   R    Comments:  while on enoxaparin therapy    Signed and Held   Signed and Held  Comprehensive metabolic panel  Tomorrow morning,   R      Signed and Held   Signed and Held  CBC  Tomorrow morning,   R     Signed and Held      Vitals/Pain Today's Vitals   03/03/18 0458 03/03/18 0813 03/03/18 0941 03/03/18 1228  BP: 137/75  126/76 (!) 144/81  Pulse: (!) 112  62 74  Resp: 16  16 20   SpO2: 94%  100% 94%  Weight:      Height:      PainSc:  4       Isolation Precautions No active isolations  Medications Medications  ciprofloxacin (CIPRO) IVPB 400 mg (0 mg Intravenous Stopped 03/03/18 0711)    Mobility non-ambulatory

## 2018-03-03 NOTE — Progress Notes (Signed)
PT Cancellation Note  Patient Details Name: Tamara SnideSusie Velez MRN: [id number] DOB: 04/16/1931   Cancelled Treatment:    Reason Eval/Treat Not Completed: Other (comment), patient  Has not moved to floor, in MRI. Will check back tomorrow.    Rada HayHill, Dianna Deshler Elizabeth 03/03/2018,[name] PT [phone]  1:41 PM

## 2018-03-03 NOTE — Progress Notes (Signed)
  PT Cancellation Note  Patient Details Name: Tamara Velez MRN: [id number] DOB: [birth year]   Cancelled Treatment:    Reason Eval/Treat Not Completed: Other (comment)Per RN, patient to move to floor, currently eating. Will evaluate after in room upstairs later today.    Rada HayHill, Torion Hulgan Elizabeth 03/03/2018, 10:04 AM Blanchard KelchKaren Jerrid Forgette PT [phone]

## 2018-03-03 NOTE — H&P (Signed)
TRH H&P   Patient Demographics:    Tamara Velez, is a 84 y.o. female  MRN: [id number]   DOB - 1931-09-20  Admit Date - 03/02/2018  Outpatient Primary MD for the patient is Tamara Axe, FNP  Referring MD/NP/PA: Sharilyn Sites  Outpatient Specialists:   Patient coming from:  home  Chief Complaint  Patient presents with  . Knee Pain      HPI:    Tamara Velez  is a 84 y.o. female, w dementia, apparently had weakness and fell today.  No syncope.   Pt had difficulty getting up and was brought to ED for evaluation. Pt denies dysuria, hematuria, fever, chills, n/v, flank pain  In Ed.  Xray R knee FINDINGS: Chondrocalcinosis in the medial and lateral compartment. No acute fracture dislocation. Osteopenia noted  IMPRESSION: 1. No fracture dislocation. 2. No joint effusion. 3. Osteopenia 4. Chondrocalcinosis.   CXR IMPRESSION: Bronchitic change.  No acute findings.  Hyperinflated lungs.  Wbc 12.0, Hgb 12.4, Plt 186  Na 141, K 3.8, Bun 19, Creatinine 0.79 Urinalysis Wbc 6-30, Rbc 6-30   Pt will be admitted for weakness likely secondary to UTI, dementia.     Review of systems:    In addition to the HPI above, No Fever-chills, No Headache, No changes with Vision or hearing, No problems swallowing food or Liquids, No Chest pain, Cough or Shortness of Breath, No Abdominal pain, No Nausea or Vommitting, Bowel movements are regular, No Blood in stool or Urine, No dysuria, No new skin rashes or bruises, No new joints pains-aches,  , No recent weight gain or loss, No polyuria, polydypsia or polyphagia, No significant Mental Stressors.  A full 10 point Review of Systems was done, except as stated above, all other Review of Systems were negative.   With Past History of the following :    Past Medical History:  Diagnosis Date  . Chest pain   . Colon  polyps    benign per pt. removed years ago  . Dementia   . Hyperlipidemia   . Insomnia   . Vitamin D deficiency   . Vitamin D deficiency       Past Surgical History:  Procedure Laterality Date  . CATARACT EXTRACTION Bilateral   . EYE SURGERY     "leaking vessels"      Social History:     Social History   Tobacco Use  . Smoking status: Never Smoker  . Smokeless tobacco: Never Used  Substance Use Topics  . Alcohol use: No     Lives - at home  Mobility - unable to walk   Family History :     Family History  Problem Relation Age of Onset  . Breast cancer Mother   . Breast cancer Sister   . Colon cancer Brother   . Colon cancer Brother       Home Medications:  Prior to Admission medications   Medication Sig Start Date End Date Taking? Authorizing Provider  Ferrous Sulfate Dried (FERROUS SULFATE CR PO) Take 65 Units by mouth daily.   Yes [provider]  LUTEIN PO Take 1 tablet by mouth daily.    Yes [provider]  memantine (NAMENDA) 5 MG tablet Take 5 mg by mouth 2 (two) times daily.   Yes [provider]  Vitamin D, Ergocalciferol, (DRISDOL) 50000 units CAPS capsule Take 50,000 Units by mouth every 7 (seven) days. 08/10/17  Yes [provider]  ipratropium (ATROVENT) 0.03 % nasal spray Place 2 sprays into both nostrils 3 (three) times daily. Patient not taking: Reported on 03/03/2018 12/10/17   Leslye Peer, MD     Allergies:     Allergies  Allergen Reactions  . Penicillins Other (See Comments)    Has patient had a PCN reaction causing immediate rash, facial/tongue/throat swelling, SOB or lightheadedness with hypotension: Unknown Has patient had a PCN reaction causing severe rash involving mucus membranes or skin necrosis: No Has patient had a PCN reaction that required hospitalization: No Has patient had a PCN reaction occurring within the last 10 years: No If all of the above answers are "NO", then may proceed  with Cephalosporin use.      Physical Exam:   Vitals  Blood pressure 116/72, pulse 73, resp. rate 14, height 5\' 5"  (1.651 m), weight 50.8 kg (112 lb), SpO2 90 %.   1. General  lying in bed in NAD,    2. Normal affect and insight, Not Suicidal or Homicidal, Awake Alert, Oriented X 2.  3. No F.N deficits, ALL C.Nerves Intact, Strength 5/5 all 4 extremities, Sensation intact all 4 extremities, Plantars down going.  4. Ears and Eyes appear Normal, Conjunctivae clear, PERRLA. Moist Oral Mucosa.  5. Supple Neck, No JVD, No cervical lymphadenopathy appriciated, No Carotid Bruits.  6. Symmetrical Chest wall movement, Good air movement bilaterally, CTAB.  7. RRR, No Gallops, Rubs or Murmurs, No Parasternal Heave.  8. Positive Bowel Sounds, Abdomen Soft, No tenderness, No organomegaly appriciated,No rebound -guarding or rigidity.  9.  No Cyanosis, Normal Skin Turgor, No Skin Rash or Bruise.  10. Good muscle tone,  joints appear normal , no effusions, Normal ROM.  11. No Palpable Lymph Nodes in Neck or Axillae     Data Review:    CBC Recent Labs  Lab 03/02/18 2257  WBC 12.0*  HGB 12.4  HCT 37.6  PLT 186  MCV 100.8*  MCH 33.2  MCHC 33.0  RDW 12.4  LYMPHSABS 1.0  MONOABS 0.8  EOSABS 0.0  BASOSABS 0.0   ------------------------------------------------------------------------------------------------------------------  Chemistries  Recent Labs  Lab 03/02/18 2257  NA 141  K 3.8  CL 106  CO2 26  GLUCOSE 107*  BUN 19  CREATININE 0.79  CALCIUM 10.5*   ------------------------------------------------------------------------------------------------------------------ estimated creatinine clearance is 41.2 mL/min (by C-G formula based on SCr of 0.79 mg/dL). ------------------------------------------------------------------------------------------------------------------ No results for input(s): TSH, T4TOTAL, T3FREE, THYROIDAB in the last 72 hours.  Invalid input(s):  FREET3  Coagulation profile No results for input(s): INR, PROTIME in the last 168 hours. ------------------------------------------------------------------------------------------------------------------- No results for input(s): DDIMER in the last 72 hours. -------------------------------------------------------------------------------------------------------------------  Cardiac Enzymes No results for input(s): CKMB, TROPONINI, MYOGLOBIN in the last 168 hours.  Invalid input(s): CK ------------------------------------------------------------------------------------------------------------------ No results found for: BNP   ---------------------------------------------------------------------------------------------------------------  Urinalysis    Component Value Date/Time   COLORURINE YELLOW 03/03/2018 0036   APPEARANCEUR HAZY (A) 03/03/2018 0036  LABSPEC 1.023 03/03/2018 0036   PHURINE 5.0 03/03/2018 0036   GLUCOSEU NEGATIVE 03/03/2018 0036   HGBUR SMALL (A) 03/03/2018 0036   BILIRUBINUR NEGATIVE 03/03/2018 0036   KETONESUR NEGATIVE 03/03/2018 0036   PROTEINUR 30 (A) 03/03/2018 0036   NITRITE NEGATIVE 03/03/2018 0036   LEUKOCYTESUR TRACE (A) 03/03/2018 0036    ----------------------------------------------------------------------------------------------------------------   Imaging Results:    Dg Chest 2 View  Result Date: 03/02/2018 CLINICAL DATA:  Short of breath EXAM: CHEST - 2 VIEW COMPARISON:  08/17/2017 FINDINGS: Normal mediastinum and cardiac silhouette. Chronic central bronchitic markings. Normal pulmonary vasculature. No effusion, infiltrate, or pneumothorax. IMPRESSION: Bronchitic change.  No acute findings.  Hyperinflated lungs. Electronically Signed   By: Genevive BiStewart  Edmunds M.D.   On: 03/02/2018 22:53   Dg Knee Complete 4 Views Right  Result Date: 03/02/2018 CLINICAL DATA:  Patient fell at her home this pm and states she was unable to get up, states she is  duzzy and a little short of breat, knee pain patella and medial side EXAM: RIGHT KNEE - COMPLETE 4+ VIEW COMPARISON:  None. FINDINGS: Chondrocalcinosis in the medial and lateral compartment. No acute fracture dislocation. Osteopenia noted IMPRESSION: 1. No fracture dislocation. 2. No joint effusion. 3. Osteopenia 4. Chondrocalcinosis. Electronically Signed   By: Genevive BiStewart  Edmunds M.D.   On: 03/02/2018 22:46       Assessment & Plan:    Active Problems:   Weakness    Weakness Check CT brain PT to evaluate and tx, see if qualifies for SNF placement  Fall Xray right hip  UTI Urine culture pending Cipro iv pharmacy to dose  Dementia Cont Namenda    DVT Prophylaxis  Lovenox - SCDs   AM Labs Ordered, also please review Full Orders  Family Communication: Admission, patients condition and plan of care including tests being ordered have been discussed with the patient who indicate understanding and agree with the plan and Code Status.  Code Status FULL CODE  Likely DC to  home  Condition GUARDED   Consults called: none  Admission status:   inpatient   Time spent in minutes : 45    Pearson GrippeJames Charlye Spare M.D on 03/03/2018 at 3:02 AM  Between 7am to 7pm - Pager - [phone]. After 7pm go to [email or web address] - password [email or web address]  [facility] - Office  [phone]

## 2018-03-03 NOTE — Progress Notes (Signed)
Pharmacy Antibiotic Note  Samara SnideSusie Rigaud is a 84 y.o. female admitted on 03/02/2018 with UTI.  Pharmacy has been consulted for cipro dosing.  Plan: Cipro 400 mg IV q12h F/u scr/cultures  Height: 5\' 5"  (165.1 cm) Weight: 112 lb (50.8 kg) IBW/kg (Calculated) : 57  No data recorded.  Recent Labs  Lab 03/02/18 2257  WBC 12.0*  CREATININE 0.79    Estimated Creatinine Clearance: 41.2 mL/min (by C-G formula based on SCr of 0.79 mg/dL).    Allergies  Allergen Reactions  . Penicillins Other (See Comments)    Has patient had a PCN reaction causing immediate rash, facial/tongue/throat swelling, SOB or lightheadedness with hypotension: Unknown Has patient had a PCN reaction causing severe rash involving mucus membranes or skin necrosis: No Has patient had a PCN reaction that required hospitalization: No Has patient had a PCN reaction occurring within the last 10 years: No If all of the above answers are "NO", then may proceed with Cephalosporin use.     Antimicrobials this admission: 4/18 cipro >>    >>   Dose adjustments this admission:   Microbiology results:  BCx:   UCx:    Sputum:    MRSA PCR:   Thank you for allowing pharmacy to be a part of this patient's care.  Lorenza EvangelistGreen, Latavia Goga R 03/03/2018 4:27 AM

## 2018-03-03 NOTE — ED Notes (Signed)
Bed: WA17 Expected date:  Expected time:  Means of arrival:  Comments: 

## 2018-03-03 NOTE — ED Notes (Signed)
Called floor and RN asked me to give them more time for report due to an emergent situation on the floor.

## 2018-03-03 NOTE — Progress Notes (Signed)
Patient seen and examined at bedside, patient admitted after midnight, please see earlier detailed admission note by Dr.James Kim. Briefly, patient presented after a fall. Patient has a history of recurrent falls. No fracture but urinalysis suggests UTI. Will get MRI of back.   Tamara Hawkingalph Bernyce Brimley, MD [facility] 03/03/2018, 12:09 PM Pager: [phone]

## 2018-03-03 NOTE — ED Notes (Addendum)
There will be a delay in transfer to the floor for this patient. MRI is coming to get patient and then they will come back here and go straight to the floor afterward.

## 2018-03-04 DIAGNOSIS — M25561 Pain in right knee: Secondary | ICD-10-CM | POA: Diagnosis not present

## 2018-03-04 DIAGNOSIS — R531 Weakness: Secondary | ICD-10-CM | POA: Diagnosis not present

## 2018-03-04 DIAGNOSIS — R8290 Unspecified abnormal findings in urine: Secondary | ICD-10-CM

## 2018-03-04 DIAGNOSIS — J9601 Acute respiratory failure with hypoxia: Secondary | ICD-10-CM | POA: Diagnosis not present

## 2018-03-04 DIAGNOSIS — M1611 Unilateral primary osteoarthritis, right hip: Secondary | ICD-10-CM | POA: Diagnosis not present

## 2018-03-04 LAB — URINE CULTURE: Culture: <10000 — AB

## 2018-03-04 MED ORDER — DM-GUAIFENESIN ER 30-600 MG PO TB12
1.0000 | ORAL_TABLET | Freq: Two times a day (BID) | ORAL | Status: DC | PRN
  Filled 2018-03-04: qty 1

## 2018-03-04 NOTE — Evaluation (Signed)
Physical Therapy Evaluation Patient Details Name: Tamara Velez MRN: [id number] DOB: 08-Oct-1931 Today's Date: 03/04/2018   History of Present Illness  Tamara Velez is a 84 y.o. female with dementia and weakness. She presented 03/02/18 secondary to a fall. No fractures. MRI lumbar spine without nerve pathology  Clinical Impression  The patient  Requires extensive assistance, frightful and resistive with attempts to transfer to [facility]. No family present to discuss plans for DC. Pt admitted with above diagnosis. Pt currently with functional limitations due to the deficits listed below (see PT Problem List).  Pt will benefit from skilled PT to increase their independence and safety with mobility to allow discharge to the venue listed below.       Follow Up Recommendations SNF    Equipment Recommendations  None recommended by PT    Recommendations for Other Services       Precautions / Restrictions Precautions Precautions: Fall      Mobility  Bed Mobility Overal bed mobility: Needs Assistance Bed Mobility: Supine to Sit;Sit to Supine     Supine to sit: Max assist Sit to supine: Total assist;+2 for physical assistance;+2 for safety/equipment   General bed mobility comments: assist for rolling, assist with legs and trunk maximally. Patient is fidgety and not following directions, redirection required multiple  times.  Transfers                 General transfer comment: attempted standing at RW and pivot to [facility], patient is resistive with attempts so aborted the activity.  Ambulation/Gait                Stairs            Wheelchair Mobility    Modified Rankin (Stroke Patients Only)       Balance Overall balance assessment: Needs assistance;History of Falls Sitting-balance support: Feet supported;Bilateral upper extremity supported Sitting balance-Leahy Scale: Poor Sitting balance - Comments: pushing back mostly, cues to lean forward while holding  RW Postural control: Posterior lean                                   Pertinent Vitals/Pain Pain Assessment: Faces Faces Pain Scale: Hurts whole lot Pain Location: right leg Pain Descriptors / Indicators: Discomfort Pain Intervention(s): Monitored during session;Premedicated before session;Patient requesting pain meds-RN notified;Limited activity within patient's tolerance    Home Living Family/patient expects to be discharged to:: Private residence Living Arrangements: Other relatives Available Help at Discharge: Family;Available PRN/intermittently         Home Layout: One level Home Equipment: Walker - 2 wheels Additional Comments: pt poor historian, confused, not orientated, information provided by last  encounter    Prior Function Level of Independence: Needs assistance   Gait / Transfers Assistance Needed: per previous encounter, has needed more assistance           Hand Dominance        Extremity/Trunk Assessment   Upper Extremity Assessment Upper Extremity Assessment: Generalized weakness    Lower Extremity Assessment Lower Extremity Assessment: RLE deficits/detail RLE Deficits / Details: decreased knee flexion to 60 while sitting on bed edge    Cervical / Trunk Assessment Cervical / Trunk Assessment: Kyphotic  Communication      Cognition Arousal/Alertness: Awake/alert Behavior During Therapy: Restless;Anxious Overall Cognitive Status: Impaired/Different from baseline Area of Impairment: Orientation  Orientation Level: Place;Time                    General Comments      Exercises     Assessment/Plan    PT Assessment Patient needs continued PT services  PT Problem List Decreased strength;Decreased range of motion;Decreased cognition;Decreased activity tolerance;Decreased knowledge of use of DME;Decreased balance;Decreased safety awareness;Decreased mobility;Decreased knowledge of  precautions;Pain       PT Treatment Interventions DME instruction;Therapeutic exercise;Gait training;Functional mobility training;Therapeutic activities;Patient/family education    PT Goals (Current goals can be found in the Care Plan section)  Acute Rehab PT Goals Patient Stated Goal: patient is somewhat confused but does agree to mobilize PT Goal Formulation: With patient Time For Goal Achievement: 03/18/18 Potential to Achieve Goals: Fair    Frequency Min 2X/week   Barriers to discharge Decreased caregiver support      Co-evaluation               AM-PAC PT "6 Clicks" Daily Activity  Outcome Measure Difficulty turning over in bed (including adjusting bedclothes, sheets and blankets)?: Unable Difficulty moving from lying on back to sitting on the side of the bed? : Unable Difficulty sitting down on and standing up from a chair with arms (e.g., wheelchair, bedside commode, etc,.)?: Unable Help needed moving to and from a bed to chair (including a wheelchair)?: Total Help needed walking in hospital room?: Total Help needed climbing 3-5 steps with a railing? : Total 6 Click Score: 6    End of Session Equipment Utilized During Treatment: Gait belt Activity Tolerance: Patient limited by pain Patient left: in bed;with call bell/phone within reach;with bed alarm set Nurse Communication: Mobility status PT Visit Diagnosis: History of falling (Z91.81)    Time: [id number] PT Time Calculation (min) (ACUTE ONLY): 22 min   Charges:   PT Evaluation $PT Eval Low Complexity: 1 Low     PT G CodesBlanchard Kelch:        Tyrhonda Georgiades PT [phone]   Rada HayHill, Fredonia Casalino Elizabeth 03/04/2018, 12:38 PM

## 2018-03-04 NOTE — Care Management CC44 (Signed)
Condition Code 44 Documentation Completed  Patient Details  Name: Tamara SnideSusie Velez MRN: [id number] Date of Birth: 1931-02-05   Condition Code 44 given:  Yes Patient signature on Condition Code 44 notice:  Yes Documentation of 2 MD's agreement:  Yes Code 44 added to claim:  Yes    [name], Zanaiya Calabria K, RN 03/04/2018, 2:22 PM

## 2018-03-04 NOTE — Progress Notes (Signed)
PROGRESS NOTE    Tamara Velez  ZOX:[id number] DOB: 26-Nov-1930 DOA: 03/02/2018 PCP: [name], FNP   Brief Narrative: Tamara Velez is a 84 y.o. female with dementia and weakness. She presented secondary to a fall. No fracture. Initial concern for UTI but urine culture has insignificant growth and patient has no symptoms. PT eval pending.   Assessment & Plan:   Active Problems:   Weakness   Weakness Frequent falls No fractures. MRI lumbar spine without nerve pathology. Likely deconditioning. Patient has had this weakness for near one year. No red flags. This is a chronic issue. -PT eval pending  Abnormal urinalysis Patient asymptomatic. Urine culture significant for insignificant growth. -Discontinue ciprofloxacin  Dementia Continue Namenda   DVT prophylaxis: Lovenox Code Status:   Code Status: Full Code Family Communication: Called sister with no response Disposition Plan: Discharge home vs SNF pending PT eval.   Consultants:   None  Procedures:   None  Antimicrobials:  Ciprofloxacin (4/17>>4/19)   Subjective: Weak legs.  Objective: Vitals:   03/03/18 0941 03/03/18 1228 03/03/18 1431 03/04/18 0530  BP: 126/76 (!) 144/81 (!) 144/81 (!) 106/59  Pulse: 62 74 74 64  Resp: 16 20 20 18   Temp:   98.6 F (37 C) 98.2 F (36.8 C)  TempSrc:   Oral Oral  SpO2: 100% 94%  92%  Weight:   50.8 kg (112 lb)   Height:   5\' 5"  (1.651 m)     Intake/Output Summary (Last 24 hours) at 03/04/2018 1133 Last data filed at 03/03/2018 1500 Gross per 24 hour  Intake 29.17 ml  Output -  Net 29.17 ml   Filed Weights   03/02/18 2059 03/03/18 1431  Weight: 50.8 kg (112 lb) 50.8 kg (112 lb)    Examination:  General exam: Appears calm and comfortable Respiratory system: Clear to auscultation. Respiratory effort normal. Cardiovascular system: S1 & S2 heard, RRR. No murmurs, rubs, gallops or clicks. Gastrointestinal system: Abdomen is nondistended, soft and  nontender. No organomegaly or masses felt. Normal bowel sounds heard. Central nervous system: Alert and oriented. Strength 3/5 on right LE and 4/5 on left LE. Equal reflexes.  Extremities: No edema. No calf tenderness Skin: No cyanosis. No rashes Psychiatry: Judgement and insight appear normal. Mood & affect appropriate.     Data Reviewed: I have personally reviewed following labs and imaging studies  CBC: Recent Labs  Lab 03/02/18 2257 03/03/18 1536  WBC 12.0* 7.9  NEUTROABS 10.1*  --   HGB 12.4 12.5  HCT 37.6 37.9  MCV 100.8* 100.3*  PLT 186 179   Basic Metabolic Panel: Recent Labs  Lab 03/02/18 2257 03/03/18 1536  NA 141 143  K 3.8 3.8  CL 106 106  CO2 26 28  GLUCOSE 107* 101*  BUN 19 10  CREATININE 0.79 0.62  CALCIUM 10.5* 10.2   GFR: Estimated Creatinine Clearance: 41.2 mL/min (by C-G formula based on SCr of 0.62 mg/dL). Liver Function Tests: Recent Labs  Lab 03/03/18 1536  AST 19  ALT 12*  ALKPHOS 67  BILITOT 0.9  PROT 7.2  ALBUMIN 3.7   No results for input(s): LIPASE, AMYLASE in the last 168 hours. No results for input(s): AMMONIA in the last 168 hours. Coagulation Profile: No results for input(s): INR, PROTIME in the last 168 hours. Cardiac Enzymes: No results for input(s): CKTOTAL, CKMB, CKMBINDEX, TROPONINI in the last 168 hours. BNP (last 3 results) No results for input(s): PROBNP in the last 8760 hours. HbA1C: No results for input(s):  HGBA1C in the last 72 hours. CBG: No results for input(s): GLUCAP in the last 168 hours. Lipid Profile: No results for input(s): CHOL, HDL, LDLCALC, TRIG, CHOLHDL, LDLDIRECT in the last 72 hours. Thyroid Function Tests: No results for input(s): TSH, T4TOTAL, FREET4, T3FREE, THYROIDAB in the last 72 hours. Anemia Panel: No results for input(s): VITAMINB12, FOLATE, FERRITIN, TIBC, IRON, RETICCTPCT in the last 72 hours. Sepsis Labs: No results for input(s): PROCALCITON, LATICACIDVEN in the last 168  hours.  Recent Results (from the past 240 hour(s))  Urine culture     Status: Abnormal   Collection Time: 03/03/18 12:36 AM  Result Value Ref Range Status   Specimen Description   Final    URINE, CLEAN CATCH Performed at [facility], [address] [place], [place] [address]    Special Requests   Final    NONE Performed at [facility], [address] [place], [place] [address]    Culture (A)  Final    <10,000 COLONIES/mL INSIGNIFICANT GROWTH Performed at [facility], [address] [place], [place] [address]    Report Status 03/04/2018 FINAL  Final         Radiology Studies: Dg Chest 2 View  Result Date: 03/02/2018 CLINICAL DATA:  Short of breath EXAM: CHEST - 2 VIEW COMPARISON:  08/17/2017 FINDINGS: Normal mediastinum and cardiac silhouette. Chronic central bronchitic markings. Normal pulmonary vasculature. No effusion, infiltrate, or pneumothorax. IMPRESSION: Bronchitic change.  No acute findings.  Hyperinflated lungs. Electronically Signed   By: Genevive Bi M.D.   On: 03/02/2018 22:53   Ct Head Wo Contrast  Result Date: 03/03/2018 CLINICAL DATA:  Muscle weakness resulting in fall.  Dementia. EXAM: CT HEAD WITHOUT CONTRAST TECHNIQUE: Contiguous axial images were obtained from the base of the skull through the vertex without intravenous contrast. COMPARISON:  08/18/2017 FINDINGS: BRAIN: No intraparenchymal hemorrhage, mass effect nor midline shift. Periventricular and subcortical white matter hypodensities consistent with mild-to-moderate chronic small vessel ischemic disease is identified. No acute large vascular territory infarcts. No abnormal extra-axial fluid collections. Basal cisterns are not effaced and midline. VASCULAR: Moderate calcific atherosclerosis of the carotid siphons. SKULL: No skull fracture. No significant scalp soft tissue swelling. SINUSES/ORBITS: The mastoid air-cells are clear. The included paranasal  sinuses are well-aerated.The included ocular globes and orbital contents are non-suspicious. OTHER: None. IMPRESSION: Chronic mild-to-moderate microvascular ischemic disease of the brain. No acute intracranial abnormality. Electronically Signed   By: Tollie Eth M.D.   On: 03/03/2018 03:45   Mr Lumbar Spine Wo Contrast  Result Date: 03/03/2018 CLINICAL DATA:  84 y/o F; lower extremity weakness, difficulty moving the left leg. EXAM: MRI LUMBAR SPINE WITHOUT CONTRAST TECHNIQUE: Multiplanar, multisequence MR imaging of the lumbar spine was performed. No intravenous contrast was administered. COMPARISON:  None. FINDINGS: Segmentation: Transitional S1 vertebral body with S1-2 disc. Iliolumbar ligament (series 6: Image 27). Alignment:  L5-S1 grade 1 anterolisthesis. Vertebrae: L4 inferior endplate, L3 superior endplate, T12 inferior endplate small Schmorl's nodes with mild edema. No loss of vertebral body height or abnormal disc signal. Mild heterogeneity of bone marrow signal, possibly related to anemia. Conus medullaris and cauda equina: Conus extends to the L1 level. Conus and cauda equina appear normal. Paraspinal and other soft tissues: Subcentimeter T2 hyperintense structures in bilateral kidneys compatible with cysts. Disc levels: L1-2: No significant disc displacement, foraminal stenosis, or canal stenosis. L2-3: Mild disc bulge and facet hypertrophy. No significant foraminal or canal stenosis. L3-4: Mild disc bulge and  facet hypertrophy. No significant foraminal or canal stenosis. L4-5: Mild disc bulge eccentric to the right and mild facet hypertrophy. Mild right foraminal stenosis. No canal stenosis. L5-S1: Anterolisthesis with mild disc bulge and facet hypertrophy. Moderate bilateral foraminal stenosis. Partial lateral recess effacement with disc contact on the descending S1 nerve roots. No significant canal stenosis. S1-2: No significant disc displacement, foraminal stenosis, or canal stenosis. IMPRESSION:  1. Transitional S1.  L5-S1 grade 1 anterolisthesis. 2. Mildly heterogeneous marrow signal, probably related to anemia. 3. Lumbar spondylosis greatest at L5-S1 level were disc and facet disease contributes to moderate bilateral foraminal stenosis. No significant canal stenosis. Electronically Signed   By: Mitzi HansenLance  Furusawa-Stratton M.D.   On: 03/03/2018 14:06   Dg Knee Complete 4 Views Right  Result Date: 03/02/2018 CLINICAL DATA:  Patient fell at her home this pm and states she was unable to get up, states she is duzzy and a little short of breat, knee pain patella and medial side EXAM: RIGHT KNEE - COMPLETE 4+ VIEW COMPARISON:  None. FINDINGS: Chondrocalcinosis in the medial and lateral compartment. No acute fracture dislocation. Osteopenia noted IMPRESSION: 1. No fracture dislocation. 2. No joint effusion. 3. Osteopenia 4. Chondrocalcinosis. Electronically Signed   By: Genevive BiStewart  Edmunds M.D.   On: 03/02/2018 22:46   Dg Hip Unilat With Pelvis 1v Right  Result Date: 03/03/2018 CLINICAL DATA:  Right hip pain after fall EXAM: DG HIP (WITH OR WITHOUT PELVIS) 1V RIGHT COMPARISON:  None. FINDINGS: There are skin fold artifacts projecting over the right intertrochanteric portion of the femur. This can potentially mask an underlying fracture. No joint dislocation is seen. There is lumbar spondylosis with syndesmophytes. No acute pelvic fracture. The right sacral ala is obscured by overlying bowel however. IMPRESSION: Limited study due to skin fold artifacts projecting over the intertrochanteric portion of the right femur. CT is therefore recommended to exclude occult fracture. Electronically Signed   By: Tollie Ethavid  Kwon M.D.   On: 03/03/2018 03:43        Scheduled Meds: . enoxaparin (LOVENOX) injection  40 mg Subcutaneous Q24H  . ferrous sulfate  325 mg Oral Q breakfast  . memantine  5 mg Oral BID  . Vitamin D (Ergocalciferol)  50,000 Units Oral Q7 days   Continuous Infusions: . ciprofloxacin Stopped  (03/04/18 0934)     LOS: 1 day     Jacquelin Hawkingalph Sayvion Vigen, MD [facility] 03/04/2018, 11:33 AM Pager: [phone]  If 7PM-7AM, please contact night-coverage [email or web address] Password [email or web address] 03/04/2018, 11:33 AM

## 2018-03-04 NOTE — Care Management Obs Status (Signed)
MEDICARE OBSERVATION STATUS NOTIFICATION   Patient Details  Name: Tamara Velez MRN: [id number] Date of Birth: 02-01-1931   Medicare Observation Status Notification Given:  Yes    [name], [name], RN 03/04/2018, 2:22 PM

## 2018-03-05 ENCOUNTER — Observation Stay (HOSPITAL_COMMUNITY): Payer: Medicare Other

## 2018-03-05 DIAGNOSIS — M25561 Pain in right knee: Secondary | ICD-10-CM | POA: Diagnosis not present

## 2018-03-05 DIAGNOSIS — R531 Weakness: Secondary | ICD-10-CM | POA: Diagnosis not present

## 2018-03-05 DIAGNOSIS — R8290 Unspecified abnormal findings in urine: Secondary | ICD-10-CM | POA: Diagnosis not present

## 2018-03-05 DIAGNOSIS — M1611 Unilateral primary osteoarthritis, right hip: Secondary | ICD-10-CM

## 2018-03-05 NOTE — Clinical Social Work Note (Signed)
Clinical Social Work Assessment  Patient Details  Name: Tamara Velez MRN: [id number] Date of Birth: 10/20/1931  Date of referral:  03/05/18               Reason for consult:  Facility Placement                Permission sought to share information with:  Facility Medical sales representative, Family Supports Permission granted to share information::  Yes, Verbal Permission Granted  Name::     Leta Baptist  Agency::     Relationship::  Sister  Contact Information:  [phone]  Housing/Transportation Living arrangements for the past 2 months:  Single Family Home Source of Information:  Patient, Adult Children Patient Interpreter Needed:  None Criminal Activity/Legal Involvement Pertinent to Current Situation/Hospitalization:  No - Comment as needed Significant Relationships:  Siblings Lives with:  Siblings Do you feel safe going back to the place where you live?  (PT recommending SNF) Need for family participation in patient care:  Yes (Comment)  Care giving concerns:  Patient from home with sister. Patient reported that she moved in with her sister in September 2018 after having multiple falls at home. Patient's sister confirmed that patient moved in with her in September 2018 after having multiple falls. Patient's sister reported that at baseline patient can ambulate with walker and wash her self up a little bit. Patient's sister reported that patient has a sitter that sits with patient 3 hours/3 days a week and assists patient with baths. Patient's sister reported that she is not able to physically assist patient because she has issues with her back. Patient's sister reported that patient has fell 2 days in a row. PT recommending SNF.   Social Worker assessment / plan:  CSW spoke with patient at bedside regarding PT recommendation for SNF. Patient reported that she cant currently get up and walk. Patient informed CSW that she had a fall and that is why she is in the hospital. Patient informed  CSW that she is from home with her sister and granted CSW verbal permission to speak with her sister about discharge planning.  CSW contacted patient's sister and discussed discharge planning. Patient's sister reported that she is agreeable to ST rehab because she cant care for patient in patient's current condition. Patient's sister reported that patient went to SNF in September 2018 and that patient benefited from ST rehab. Patient's sister reported that patient can return home after completing ST rehab. CSW explained SNF placement process and insurance authorization, patient's sister verbalized understanding. Patient's sister reported that patient is not able to private pay for SNF and will need insurance coverage.   CSW updated patient's attending MD.  CSW will complete FL2 and follow up with bed offers.  Barriers - Patient needs insurance authorization for SNF ST rehab.  Employment status:  Retired Database administrator PT Recommendations:  Skilled Nursing Facility Information / Referral to community resources:  Skilled Nursing Facility  Patient/Family's Response to care:  Patient's sister appreciative of CSW assistance with discharge planning.  Patient/Family's Understanding of and Emotional Response to Diagnosis, Current Treatment, and Prognosis:  Patient presented oriented during most of the assessment with some intermittent confusion. Patient able to recall going to SNF and verbalized understanding that PT is recommending SNF for ST rehab. Patient's sister involved in patient's care and verbalized plan for patient to dc to SNF for ST rehab. Patient's sister hopeful that patient will return to baseline after ST rehab and return home.  Emotional Assessment Appearance:  Appears stated age Attitude/Demeanor/Rapport:  Other(Cooperative) Affect (typically observed):  Calm Orientation:  Oriented to Self, Oriented to Place, Oriented to Situation(Intermittent  confusion) Alcohol / Substance use:  Not Applicable Psych involvement (Current and /or in the community):  No (Comment)  Discharge Needs  Concerns to be addressed:  Care Coordination Readmission within the last 30 days:  No Current discharge risk:  Physical Impairment, Dependent with Mobility Barriers to Discharge:  No SNF bed, Insurance Authorization   Antionette PolesKimberly L Nyajah Hyson, LCSW 03/05/2018, 3:43 PM

## 2018-03-05 NOTE — Progress Notes (Signed)
Tried to get pt. Out of bed to chair, pt. able to stand for about 4 seconds, but not able to move or transfer. She states she is not able to put weight on right leg. Pt. assisted back to bed and repositioned.

## 2018-03-05 NOTE — NC FL2 (Signed)
  [place] MEDICAID FL2 LEVEL OF CARE SCREENING TOOL     IDENTIFICATION  Patient Name: Tamara SnideSusie Buechele Birthdate: Feb 22, 1931 Sex: female Admission Date (Current Location): 03/02/2018  [facility] [place]   Facility and Address:  [facility],  [address], [place] [id number]      Provider Number: [id number]  Attending Physician Name and Address:  Narda BondsNettey, Ralph A, MD  Relative Name and Phone Number:  Leta BaptistMargie Lamm  [phone]    Current Level of Care: Hospital Recommended Level of Care: Skilled Nursing Facility Prior Approval Number:    Date Approved/Denied:   PASRR Number: [id number]  Discharge Plan: SNF    Current Diagnoses: Patient Active Problem List   Diagnosis Date Noted  . Abnormal urinalysis 03/04/2018  . Chronic low back pain   . Exercise hypoxemia 12/10/2017  . Generalized weakness 08/18/2017  . Normochromic normocytic anemia 08/18/2017  . Acute encephalopathy 08/18/2017  . Weakness 08/18/2017    Orientation RESPIRATION BLADDER Height & Weight     Self, Situation, Place  Normal Incontinent Weight: 112 lb (50.8 kg) Height:  5\' 5"  (165.1 cm)  BEHAVIORAL SYMPTOMS/MOOD NEUROLOGICAL BOWEL NUTRITION STATUS        Diet(see dc summary)  AMBULATORY STATUS COMMUNICATION OF NEEDS Skin   Total Care Verbally Normal                       Personal Care Assistance Level of Assistance  Bathing, Feeding, Dressing Bathing Assistance: Maximum assistance Feeding assistance: Independent Dressing Assistance: Maximum assistance     Functional Limitations Info  Sight, Hearing, Speech Sight Info: Adequate Hearing Info: Adequate Speech Info: Adequate    SPECIAL CARE FACTORS FREQUENCY  PT (By licensed PT), OT (By licensed OT)     PT Frequency: 5x/week OT Frequency: 5x/week            Contractures Contractures Info: Not present    Additional Factors Info  Code Status, Allergies Code Status Info: Full code Allergies  Info: penicillins           Current Medications (03/05/2018):  This is the current hospital active medication list Current Facility-Administered Medications  Medication Dose Route Frequency Provider Last Rate Last Dose  . acetaminophen (TYLENOL) tablet 650 mg  650 mg Oral Q6H PRN Pearson GrippeKim, James, MD       Or  . acetaminophen (TYLENOL) suppository 650 mg  650 mg Rectal Q6H PRN Pearson GrippeKim, James, MD      . dextromethorphan-guaiFENesin [facility](MUCINEX DM) 30-600 MG per 12 hr tablet 1 tablet  1 tablet Oral BID PRN Bodenheimer, Charles A, NP      . enoxaparin (LOVENOX) injection 40 mg  40 mg Subcutaneous Q24H Pearson GrippeKim, James, MD   40 mg at 03/04/18 2118  . ferrous sulfate tablet 325 mg  325 mg Oral Q breakfast Pearson GrippeKim, James, MD   325 mg at 03/05/18 1007  . memantine (NAMENDA) tablet 5 mg  5 mg Oral BID Pearson GrippeKim, James, MD   5 mg at 03/05/18 1007  . Vitamin D (Ergocalciferol) (DRISDOL) capsule 50,000 Units  50,000 Units Oral Q7 days Pearson GrippeKim, James, MD   50,000 Units at 03/04/18 [id number]     Discharge Medications: Please see discharge summary for a list of discharge medications.  Relevant Imaging Results:  Relevant Lab Results:   Additional Information SS#[id number]  Antionette PolesKimberly L Breindel Collier, LCSW

## 2018-03-05 NOTE — Discharge Summary (Signed)
Physician Discharge Summary  Tamara Velez ZOX:[id number] DOB: 22-May-1931 DOA: 03/02/2018  PCP: [name], FNP  Admit date: 03/02/2018 Discharge date: 03/05/2018  Admitted From: Home Disposition: SNF  Recommendations for Outpatient Follow-up:  1. Follow up with PCP in 1 week 2. Please obtain BMP/CBC in one week 3. Please follow up on the following pending results: None  Home Health: SNF  Discharge Condition: Stable CODE STATUS: Full code Diet recommendation: Heart healthy   Brief/Interim Summary:  Admission HPI written by Pearson Grippe, MD    HPI:   Tamara Velez  is a 84 y.o. female, w dementia, apparently had weakness and fell today.  No syncope.   Pt had difficulty getting up and was brought to ED for evaluation. Pt denies dysuria, hematuria, fever, chills, n/v, flank pain  In Ed.  Xray R knee FINDINGS: Chondrocalcinosis in the medial and lateral compartment. No acute fracture dislocation. Osteopenia noted  IMPRESSION: 1. No fracture dislocation. 2. No joint effusion. 3. Osteopenia 4. Chondrocalcinosis.   CXR IMPRESSION: Bronchitic change. No acute findings. Hyperinflated lungs.  Wbc 12.0, Hgb 12.4, Plt 186  Na 141, K 3.8, Bun 19, Creatinine 0.79 Urinalysis Wbc 6-30, Rbc 6-30    Hospital course:  Weakness Frequent falls Osteoarthritis No fractures. MRI lumbar spine without nerve pathology. Likely deconditioning. Patient has had this weakness for near one year. No red flags. This is a chronic issue. PT recommended SNF. CT hip obtained for patient's inability to bear weight and was significant for moderate to severe osteoarthritis. Pain management and physical therapy for management.  Abnormal urinalysis Patient asymptomatic. Urine culture significant for insignificant growth. Empirically treated with ciprofloxacin, which was discontinued.  Dementia Continued Namenda  Discharge Diagnoses:  Active Problems:   Weakness   Abnormal  urinalysis    Discharge Instructions  Discharge Instructions    Diet - low sodium heart healthy   Complete by:  As directed    Increase activity slowly   Complete by:  As directed      Allergies as of 03/05/2018      Reactions   Penicillins Other (See Comments)   Has patient had a PCN reaction causing immediate rash, facial/tongue/throat swelling, SOB or lightheadedness with hypotension: Unknown Has patient had a PCN reaction causing severe rash involving mucus membranes or skin necrosis: No Has patient had a PCN reaction that required hospitalization: No Has patient had a PCN reaction occurring within the last 10 years: No If all of the above answers are "NO", then may proceed with Cephalosporin use.      Medication List    STOP taking these medications   ipratropium 0.03 % nasal spray Commonly known as:  ATROVENT     TAKE these medications   FERROUS SULFATE CR PO Take 65 Units by mouth daily.   LUTEIN PO Take 1 tablet by mouth daily.   memantine 5 MG tablet Commonly known as:  NAMENDA Take 5 mg by mouth 2 (two) times daily.   Vitamin D (Ergocalciferol) 50000 units Caps capsule Commonly known as:  DRISDOL Take 50,000 Units by mouth every 7 (seven) days.       Allergies  Allergen Reactions  . Penicillins Other (See Comments)    Has patient had a PCN reaction causing immediate rash, facial/tongue/throat swelling, SOB or lightheadedness with hypotension: Unknown Has patient had a PCN reaction causing severe rash involving mucus membranes or skin necrosis: No Has patient had a PCN reaction that required hospitalization: No Has patient had a PCN  reaction occurring within the last 10 years: No If all of the above answers are "NO", then may proceed with Cephalosporin use.     Consultations:  None   Procedures/Studies: Dg Chest 2 View  Result Date: 03/02/2018 CLINICAL DATA:  Short of breath EXAM: CHEST - 2 VIEW COMPARISON:  08/17/2017 FINDINGS: Normal  mediastinum and cardiac silhouette. Chronic central bronchitic markings. Normal pulmonary vasculature. No effusion, infiltrate, or pneumothorax. IMPRESSION: Bronchitic change.  No acute findings.  Hyperinflated lungs. Electronically Signed   By: Genevive Bi M.D.   On: 03/02/2018 22:53   Ct Head Wo Contrast  Result Date: 03/03/2018 CLINICAL DATA:  Muscle weakness resulting in fall.  Dementia. EXAM: CT HEAD WITHOUT CONTRAST TECHNIQUE: Contiguous axial images were obtained from the base of the skull through the vertex without intravenous contrast. COMPARISON:  08/18/2017 FINDINGS: BRAIN: No intraparenchymal hemorrhage, mass effect nor midline shift. Periventricular and subcortical white matter hypodensities consistent with mild-to-moderate chronic small vessel ischemic disease is identified. No acute large vascular territory infarcts. No abnormal extra-axial fluid collections. Basal cisterns are not effaced and midline. VASCULAR: Moderate calcific atherosclerosis of the carotid siphons. SKULL: No skull fracture. No significant scalp soft tissue swelling. SINUSES/ORBITS: The mastoid air-cells are clear. The included paranasal sinuses are well-aerated.The included ocular globes and orbital contents are non-suspicious. OTHER: None. IMPRESSION: Chronic mild-to-moderate microvascular ischemic disease of the brain. No acute intracranial abnormality. Electronically Signed   By: Tollie Eth M.D.   On: 03/03/2018 03:45   Mr Lumbar Spine Wo Contrast  Result Date: 03/03/2018 CLINICAL DATA:  84 y/o F; lower extremity weakness, difficulty moving the left leg. EXAM: MRI LUMBAR SPINE WITHOUT CONTRAST TECHNIQUE: Multiplanar, multisequence MR imaging of the lumbar spine was performed. No intravenous contrast was administered. COMPARISON:  None. FINDINGS: Segmentation: Transitional S1 vertebral body with S1-2 disc. Iliolumbar ligament (series 6: Image 27). Alignment:  L5-S1 grade 1 anterolisthesis. Vertebrae: L4 inferior  endplate, L3 superior endplate, T12 inferior endplate small Schmorl's nodes with mild edema. No loss of vertebral body height or abnormal disc signal. Mild heterogeneity of bone marrow signal, possibly related to anemia. Conus medullaris and cauda equina: Conus extends to the L1 level. Conus and cauda equina appear normal. Paraspinal and other soft tissues: Subcentimeter T2 hyperintense structures in bilateral kidneys compatible with cysts. Disc levels: L1-2: No significant disc displacement, foraminal stenosis, or canal stenosis. L2-3: Mild disc bulge and facet hypertrophy. No significant foraminal or canal stenosis. L3-4: Mild disc bulge and facet hypertrophy. No significant foraminal or canal stenosis. L4-5: Mild disc bulge eccentric to the right and mild facet hypertrophy. Mild right foraminal stenosis. No canal stenosis. L5-S1: Anterolisthesis with mild disc bulge and facet hypertrophy. Moderate bilateral foraminal stenosis. Partial lateral recess effacement with disc contact on the descending S1 nerve roots. No significant canal stenosis. S1-2: No significant disc displacement, foraminal stenosis, or canal stenosis. IMPRESSION: 1. Transitional S1.  L5-S1 grade 1 anterolisthesis. 2. Mildly heterogeneous marrow signal, probably related to anemia. 3. Lumbar spondylosis greatest at L5-S1 level were disc and facet disease contributes to moderate bilateral foraminal stenosis. No significant canal stenosis. Electronically Signed   By: Mitzi Hansen M.D.   On: 03/03/2018 14:06   Ct Hip Right Wo Contrast  Result Date: 03/05/2018 CLINICAL DATA:  Right hip pain to bear weight since a fall and inability 03/02/2018. Initial encounter. EXAM: CT OF THE RIGHT HIP WITHOUT CONTRAST TECHNIQUE: Multidetector CT imaging of the right hip was performed according to the standard protocol. Multiplanar CT image reconstructions  were also generated. COMPARISON:  Plain films right hip 03/03/2017. FINDINGS:  Bones/Joint/Cartilage No hip fracture or joint or other acute bony or joint abnormality is seen. 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 Moderate to moderately severe right hip osteoarthritis is seen with joint space narrowing and osteophytosis present. No right hip joint effusion. Ligaments Suboptimally assessed by CT. Muscles and Tendons Appear intact. Soft tissues Imaged intrapelvic contents demonstrate a small volume of free pelvic fluid. IMPRESSION: Negative for right hip fracture. No acute bony or joint abnormality is seen. Moderate to moderately severe right hip osteoarthritis. Small volume of free pelvic fluid. Cause of the fluid is not identified. Electronically Signed   By: Drusilla Kannerhomas  Dalessio M.D.   On: 03/05/2018 13:11   Dg Knee Complete 4 Views Right  Result Date: 03/02/2018 CLINICAL DATA:  Patient fell at her home this pm and states she was unable to get up, states she is duzzy and a little short of breat, knee pain patella and medial side EXAM: RIGHT KNEE - COMPLETE 4+ VIEW COMPARISON:  None. FINDINGS: Chondrocalcinosis in the medial and lateral compartment. No acute fracture dislocation. Osteopenia noted IMPRESSION: 1. No fracture dislocation. 2. No joint effusion. 3. Osteopenia 4. Chondrocalcinosis. Electronically Signed   By: Genevive BiStewart  Edmunds M.D.   On: 03/02/2018 22:46   Dg Hip Unilat With Pelvis 1v Right  Result Date: 03/03/2018 CLINICAL DATA:  Right hip pain after fall EXAM: DG HIP (WITH OR WITHOUT PELVIS) 1V RIGHT COMPARISON:  None. FINDINGS: There are skin fold artifacts projecting over the right intertrochanteric portion of the femur. This can potentially mask an underlying fracture. No joint dislocation is seen. There is lumbar spondylosis with syndesmophytes. No acute pelvic fracture. The right sacral ala is obscured by overlying bowel however. IMPRESSION: Limited study due to skin fold artifacts projecting over the intertrochanteric portion of the right femur. CT is therefore recommended to  exclude occult fracture. Electronically Signed   By: Tollie Ethavid  Kwon M.D.   On: 03/03/2018 03:43     Subjective: No issues today.  Discharge Exam: Vitals:   03/04/18 2053 03/05/18 0622  BP: 132/79 114/71  Pulse: 76 65  Resp: 16 16  Temp: 98.4 F (36.9 C) 98.3 F (36.8 C)  SpO2: 91% 91%   Vitals:   03/04/18 0530 03/04/18 1303 03/04/18 2053 03/05/18 0622  BP: (!) 106/59 119/74 132/79 114/71  Pulse: 64 70 76 65  Resp: 18 16 16 16   Temp: 98.2 F (36.8 C) 98.5 F (36.9 C) 98.4 F (36.9 C) 98.3 F (36.8 C)  TempSrc: Oral Oral Oral Oral  SpO2: 92% 93% 91% 91%  Weight:      Height:        General: Pt is alert, awake, not in acute distress Cardiovascular: RRR, S1/S2 +, no rubs, no gallops Respiratory: CTA bilaterally, no wheezing, no rhonchi Abdominal: Soft, NT, ND, bowel sounds + Extremities: no edema, no cyanosis    The results of significant diagnostics from this hospitalization (including imaging, microbiology, ancillary and laboratory) are listed below for reference.     Microbiology: Recent Results (from the past 240 hour(s))  Urine culture     Status: Abnormal   Collection Time: 03/03/18 12:36 AM  Result Value Ref Range Status   Specimen Description   Final    URINE, CLEAN CATCH Performed at [facility], [address] [place], [place] [address]    Special Requests   Final    NONE Performed at [facility], [address] [place], [place] [address]    Culture (A)  Final    <10,000 COLONIES/mL INSIGNIFICANT GROWTH Performed at [facility], [address] [place], [place] [address]    Report Status 03/04/2018 FINAL  Final     Labs: BNP (last 3 results) No results for input(s): BNP in the last 8760 hours. Basic Metabolic Panel: Recent Labs  Lab 03/02/18 2257 03/03/18 1536  NA 141 143  K 3.8 3.8  CL 106 106  CO2 26 28  GLUCOSE 107* 101*  BUN 19 10  CREATININE 0.79 0.62  CALCIUM 10.5* 10.2   Liver  Function Tests: Recent Labs  Lab 03/03/18 1536  AST 19  ALT 12*  ALKPHOS 67  BILITOT 0.9  PROT 7.2  ALBUMIN 3.7   No results for input(s): LIPASE, AMYLASE in the last 168 hours. No results for input(s): AMMONIA in the last 168 hours. CBC: Recent Labs  Lab 03/02/18 2257 03/03/18 1536  WBC 12.0* 7.9  NEUTROABS 10.1*  --   HGB 12.4 12.5  HCT 37.6 37.9  MCV 100.8* 100.3*  PLT 186 179   Cardiac Enzymes: No results for input(s): CKTOTAL, CKMB, CKMBINDEX, TROPONINI in the last 168 hours. BNP: Invalid input(s): POCBNP CBG: No results for input(s): GLUCAP in the last 168 hours. D-Dimer No results for input(s): DDIMER in the last 72 hours. Hgb A1c No results for input(s): HGBA1C in the last 72 hours. Lipid Profile No results for input(s): CHOL, HDL, LDLCALC, TRIG, CHOLHDL, LDLDIRECT in the last 72 hours. Thyroid function studies No results for input(s): TSH, T4TOTAL, T3FREE, THYROIDAB in the last 72 hours.  Invalid input(s): FREET3 Anemia work up No results for input(s): VITAMINB12, FOLATE, FERRITIN, TIBC, IRON, RETICCTPCT in the last 72 hours. Urinalysis    Component Value Date/Time   COLORURINE YELLOW 03/03/2018 0036   APPEARANCEUR HAZY (A) 03/03/2018 0036   LABSPEC 1.023 03/03/2018 0036   PHURINE 5.0 03/03/2018 0036   GLUCOSEU NEGATIVE 03/03/2018 0036   HGBUR SMALL (A) 03/03/2018 0036   BILIRUBINUR NEGATIVE 03/03/2018 0036   KETONESUR NEGATIVE 03/03/2018 0036   PROTEINUR 30 (A) 03/03/2018 0036   NITRITE NEGATIVE 03/03/2018 0036   LEUKOCYTESUR TRACE (A) 03/03/2018 0036   Sepsis Labs Invalid input(s): PROCALCITONIN,  WBC,  LACTICIDVEN Microbiology Recent Results (from the past 240 hour(s))  Urine culture     Status: Abnormal   Collection Time: 03/03/18 12:36 AM  Result Value Ref Range Status   Specimen Description   Final    URINE, CLEAN CATCH Performed at [facility], [address] [place], [place] [address]    Special Requests    Final    NONE Performed at [facility], [address], [place], [place] [address]    Culture (A)  Final    <10,000 COLONIES/mL INSIGNIFICANT GROWTH Performed at [facility], [address] [place], [place] [address]    Report Status 03/04/2018 FINAL  Final     SIGNED:   Jacquelin Hawking, MD [facility] 03/05/2018, 1:59 PM Pager [phone]  If 7PM-7AM, please contact night-coverage [email or web address] Password [email or web address]

## 2018-03-06 DIAGNOSIS — M1611 Unilateral primary osteoarthritis, right hip: Secondary | ICD-10-CM | POA: Diagnosis not present

## 2018-03-06 DIAGNOSIS — R531 Weakness: Secondary | ICD-10-CM | POA: Diagnosis not present

## 2018-03-06 DIAGNOSIS — R8290 Unspecified abnormal findings in urine: Secondary | ICD-10-CM | POA: Diagnosis not present

## 2018-03-06 NOTE — Care Management (Signed)
Spoke with patient's sister, Delray AltMargie per CSW requests.  [name] sister very upset that the patient is discharged and states she cannot come home because she requires more care than she can give.  [name] sister discussed in detail her other obligations and the problems she has taking care of the patient.  She has an appointment in the morning that she cannot change and is not willing for patient to come home prior to that appointment.  Delray AltMargie states that patient cannot walk and is confused and that she should stay in the hospital until she is better or she can go to a facility.    Attempted to offer home health options, but sister reiterated to me that the patient is not coming home with her.  She states she will hope that patient can go to a facility tomorrow.

## 2018-03-06 NOTE — Social Work (Addendum)
CSW f/u on SNF offers as patient needs bed offers and Insurance Auth for SNF placement.  Once CSW receives SNF offers, CSW will discuss with patient and sister about facilities.  CSW will continue to follow up.  1:31pm: CSW spoke with sister, Judie PetitM. Lamm and discussed SNF offers. Family accepted bed offer from [facility].  CSW confirmed SNF bed offer with facility.  SNF will f/u for Insurance Auth on Monday as [employer] not open on the weekend. SNF shld have Insurance Auth 4/22 for discharge.  Keene BreathPatricia Shaneta Cervenka, LCSW Clinical Social Worker-[facility] [phone]

## 2018-03-06 NOTE — Social Work (Addendum)
CSW was advised by Asst. Clini Director Z.Shon BatonBrooks that CSW needs to get RNCM involved as patient is custodial and is in observation status and cannot remain in hospital and must discharge. CSW contacted RNCM and left message to assist with home needs. CSW then called sister and left message requesting a call back to discuss the disposition as patient is in observation status and would need to discharge home as the family cannot private pay for SNF.  CSW called Dedra at SNF and advised of same.  3;00pm CSW was able to make contacted with sister [name] ([phone]) and CSW discussed the discharge and patient being in observation status. Sister indicated that she is in her 7670's with shoulder and back issues and cannot care for patient. Sister indicated that her daughter is disabled and they do not have help for patient to return home without going to nursing facility. CSW validated her concerns and dissatisfaction with this process. CSW advised that [facility] to assist with the disposition.  CSW f/u.  Keene BreathPatricia Hyun Marsalis, Tamara Velez Clinical Social Worker [phone]

## 2018-03-06 NOTE — Progress Notes (Signed)
PROGRESS NOTE    Tamara Velez  ZOX:[id number] DOB: 1930-11-26 DOA: 03/02/2018 PCP: [name], FNP   Brief Narrative: Tamara Velez is a 84 y.o. female with dementia and weakness. She presented secondary to a fall. No fracture. Initial concern for UTI but urine culture has insignificant growth and patient has no symptoms. PT eval pending.   Assessment & Plan:   Active Problems:   Weakness   Abnormal urinalysis   Weakness Frequent falls Right hip osteoarthritis No fractures. MRI lumbar spine without nerve pathology. Likely deconditioning. Patient has had this weakness for near one year. No red flags. This is a chronic issue. CT right hip negative for fracture, significant for moderate-severe osteoarthritis. -PT eval: SNF  Abnormal urinalysis Patient asymptomatic. Urine culture significant for insignificant growth. Discontinue ciprofloxacin.  Dementia Continue Namenda   DVT prophylaxis: Lovenox Code Status:   Code Status: Full Code Family Communication: None at bedside Disposition Plan: Discharge SNF when bed available. Medically stable for discharge. Discharge summary written 4/20 and will be updated once bed available.   Consultants:   None  Procedures:   None  Antimicrobials:  Ciprofloxacin (4/17>>4/19)   Subjective: No concerns today.  Objective: Vitals:   03/04/18 2053 03/05/18 0622 03/05/18 2006 03/06/18 0438  BP: 132/79 114/71 125/77 127/70  Pulse: 76 65 84 72  Resp: 16 16 16 16   Temp: 98.4 F (36.9 C) 98.3 F (36.8 C) 99.5 F (37.5 C) 98.5 F (36.9 C)  TempSrc: Oral Oral Oral Oral  SpO2: 91% 91% 90% 90%  Weight:      Height:        Intake/Output Summary (Last 24 hours) at 03/06/2018 1142 Last data filed at 03/06/2018 0441 Gross per 24 hour  Intake 120 ml  Output 350 ml  Net -230 ml   Filed Weights   03/02/18 2059 03/03/18 1431  Weight: 50.8 kg (112 lb) 50.8 kg (112 lb)    Examination:  General exam: Appears calm and  comfortable Respiratory system: Mild rales at bases, otherwise clear. No increased breath sounds Cardiovascular system: S1 & S2 heard, RRR. No murmurs, rubs, gallops or clicks. Gastrointestinal system: Abdomen is nondistended, soft and nontender. No organomegaly or masses felt. Normal bowel sounds heard. Central nervous system: Alert, oriented to person. Extremities: No edema. No calf tenderness Skin: No cyanosis. No rashes    Data Reviewed: I have personally reviewed following labs and imaging studies  CBC: Recent Labs  Lab 03/02/18 2257 03/03/18 1536  WBC 12.0* 7.9  NEUTROABS 10.1*  --   HGB 12.4 12.5  HCT 37.6 37.9  MCV 100.8* 100.3*  PLT 186 179   Basic Metabolic Panel: Recent Labs  Lab 03/02/18 2257 03/03/18 1536  NA 141 143  K 3.8 3.8  CL 106 106  CO2 26 28  GLUCOSE 107* 101*  BUN 19 10  CREATININE 0.79 0.62  CALCIUM 10.5* 10.2   GFR: Estimated Creatinine Clearance: 41.2 mL/min (by C-G formula based on SCr of 0.62 mg/dL). Liver Function Tests: Recent Labs  Lab 03/03/18 1536  AST 19  ALT 12*  ALKPHOS 67  BILITOT 0.9  PROT 7.2  ALBUMIN 3.7   No results for input(s): LIPASE, AMYLASE in the last 168 hours. No results for input(s): AMMONIA in the last 168 hours. Coagulation Profile: No results for input(s): INR, PROTIME in the last 168 hours. Cardiac Enzymes: No results for input(s): CKTOTAL, CKMB, CKMBINDEX, TROPONINI in the last 168 hours. BNP (last 3 results) No results for input(s): PROBNP in the  last 8760 hours. HbA1C: No results for input(s): HGBA1C in the last 72 hours. CBG: No results for input(s): GLUCAP in the last 168 hours. Lipid Profile: No results for input(s): CHOL, HDL, LDLCALC, TRIG, CHOLHDL, LDLDIRECT in the last 72 hours. Thyroid Function Tests: No results for input(s): TSH, T4TOTAL, FREET4, T3FREE, THYROIDAB in the last 72 hours. Anemia Panel: No results for input(s): VITAMINB12, FOLATE, FERRITIN, TIBC, IRON, RETICCTPCT in the  last 72 hours. Sepsis Labs: No results for input(s): PROCALCITON, LATICACIDVEN in the last 168 hours.  Recent Results (from the past 240 hour(s))  Urine culture     Status: Abnormal   Collection Time: 03/03/18 12:36 AM  Result Value Ref Range Status   Specimen Description   Final    URINE, CLEAN CATCH Performed at [facility], [address] [place], [place] [address]    Special Requests   Final    NONE Performed at [facility], [address] [place], [place] [address]    Culture (A)  Final    <10,000 COLONIES/mL INSIGNIFICANT GROWTH Performed at [facility], [address] [place], [place] [address]    Report Status 03/04/2018 FINAL  Final         Radiology Studies: Ct Hip Right Wo Contrast  Result Date: 03/05/2018 CLINICAL DATA:  Right hip pain to bear weight since a fall and inability 03/02/2018. Initial encounter. EXAM: CT OF THE RIGHT HIP WITHOUT CONTRAST TECHNIQUE: Multidetector CT imaging of the right hip was performed according to the standard protocol. Multiplanar CT image reconstructions were also generated. COMPARISON:  Plain films right hip 03/03/2017. FINDINGS: Bones/Joint/Cartilage No hip fracture or joint or other acute bony or joint abnormality is seen. 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 Moderate to moderately severe right hip osteoarthritis is seen with joint space narrowing and osteophytosis present. No right hip joint effusion. Ligaments Suboptimally assessed by CT. Muscles and Tendons Appear intact. Soft tissues Imaged intrapelvic contents demonstrate a small volume of free pelvic fluid. IMPRESSION: Negative for right hip fracture. No acute bony or joint abnormality is seen. Moderate to moderately severe right hip osteoarthritis. Small volume of free pelvic fluid. Cause of the fluid is not identified. Electronically Signed   By: Drusilla Kannerhomas  Dalessio M.D.   On: 03/05/2018 13:11        Scheduled Meds: . enoxaparin  (LOVENOX) injection  40 mg Subcutaneous Q24H  . ferrous sulfate  325 mg Oral Q breakfast  . memantine  5 mg Oral BID  . Vitamin D (Ergocalciferol)  50,000 Units Oral Q7 days   Continuous Infusions:    LOS: 1 day     Jacquelin Hawkingalph Marleena Shubert, MD [facility] 03/06/2018, 11:42 AM Pager: [phone]  If 7PM-7AM, please contact night-coverage [email or web address] Password [email or web address] 03/06/2018, 11:42 AM

## 2018-03-07 ENCOUNTER — Observation Stay (HOSPITAL_COMMUNITY): Payer: Medicare Other

## 2018-03-07 DIAGNOSIS — J9601 Acute respiratory failure with hypoxia: Secondary | ICD-10-CM

## 2018-03-07 DIAGNOSIS — R8290 Unspecified abnormal findings in urine: Secondary | ICD-10-CM | POA: Diagnosis not present

## 2018-03-07 DIAGNOSIS — R531 Weakness: Secondary | ICD-10-CM | POA: Diagnosis not present

## 2018-03-07 DIAGNOSIS — M1611 Unilateral primary osteoarthritis, right hip: Secondary | ICD-10-CM | POA: Diagnosis not present

## 2018-03-07 NOTE — Progress Notes (Signed)
LCSW following for SNF placement.  PAtietn has bed at [facility]. LCSW attempted to reach sister by home phone and cell phone to complete paperwork with facility.   LCSW will continue to follow for dc needs.   Tamara GandyBernette Christyana Corwin, LSCW [facility] CSW [phone]

## 2018-03-07 NOTE — Progress Notes (Signed)
Pts O2 sats sustaining at 89 after encouraging deep breathing and sitting upright.. Placed on 1L O2 nasal cannula will continue to monitor.

## 2018-03-07 NOTE — Progress Notes (Signed)
[id number]/Sander Speckman,BSN,RN3,[phone]/Patient discharged to home orders checked no discharge needs.

## 2018-03-07 NOTE — Clinical Social Work Placement (Addendum)
    3:09 PM Patient and family chose bed at [facility].  LCSW confirmed bed with facility. Marland Kitchen.  LCSW faxed dc documents.  Patient will transport by [facility].  LCSW notified family of transport.   RN report number: [id number] [phone]  [facility]  CLINICAL SOCIAL WORK PLACEMENT  NOTE  Date:  03/07/2018  Patient Details  Name: Tamara Velez MRN: [id number] Date of Birth: 11-21-1930  Clinical Social Work is seeking post-discharge placement for this patient at the Skilled  Nursing Facility level of care (*CSW will initial, date and re-position this form in  chart as items are completed):  Yes   Patient/family provided with [facility] Clinical Social Work Department's list of facilities offering this level of care within the geographic area requested by the patient (or if unable, by the patient's family).  Yes   Patient/family informed of their freedom to choose among providers that offer the needed level of care, that participate in Medicare, Medicaid or managed care program needed by the patient, have an available bed and are willing to accept the patient.  Yes   Patient/family informed of [facility]'s ownership interest in [facility] and [facility], as well as of the fact that they are under no obligation to receive care at these facilities.  PASRR submitted to EDS on       PASRR number received on 03/07/18     Existing PASRR number confirmed on       FL2 transmitted to all facilities in geographic area requested by pt/family on 03/07/18     FL2 transmitted to all facilities within larger geographic area on       Patient informed that his/her managed care company has contracts with or will negotiate with certain facilities, including the following:        Yes   Patient/family informed of bed offers received.  [facility]     Physician recommends and patient chooses bed at      [facility].  Patient to be transferred to facility by EMS     Patient family notified on 03/07/18 of transfer.  Name of family member notified:  [name]   PHYSICIAN       Additional Comment:    _______________________________________________ Coralyn HellingBernette Natori Gudino, LCSW 03/07/2018, 3:09 PM

## 2018-03-07 NOTE — Progress Notes (Signed)
PROGRESS NOTE    Tamara Velez  BJY:[id number] DOB: 1931-06-19 DOA: 03/02/2018 PCP: [name], FNP   Brief Narrative: Tamara Velez is a 84 y.o. female with dementia and weakness. She presented secondary to a fall. No fracture. Initial concern for UTI but urine culture has insignificant growth and patient has no symptoms. PT eval pending.   Assessment & Plan:   Active Problems:   Weakness   Abnormal urinalysis   Osteoarthritis of right hip   Weakness Frequent falls Right hip osteoarthritis No fractures. MRI lumbar spine without nerve pathology. Likely deconditioning. Patient has had this weakness for near one year. No red flags. This is a chronic issue. CT right hip negative for fracture, significant for moderate-severe osteoarthritis. -PT eval: SNF  Abnormal urinalysis Patient asymptomatic. Urine culture significant for insignificant growth. Discontinue ciprofloxacin.  Dementia Continue Nam[place]  Acute respiratory failure with hypoxia Episode occurred over night. Placed on oxygen -chest x-ray -wean to room air   DVT prophylaxis: Lovenox Code Status:   Code Status: Full Code Family Communication: None at bedside Disposition Plan: Discharge SNF when bed available. Discharge summary written 4/20 and will be updated once bed available.   Consultants:   None  Procedures:   None  Antimicrobials:  Ciprofloxacin (4/17>>4/19)   Subjective: No cough, fever, chest pain. Wants to sit up to eat.  Objective: Vitals:   03/06/18 0438 03/06/18 1410 03/06/18 2053 03/07/18 0613  BP: 127/70 (!) 116/41 135/90 114/62  Pulse: 72 84 78 73  Resp: 16 18 18 18   Temp: 98.5 F (36.9 C) 98.1 F (36.7 C) 99.4 F (37.4 C) 98.4 F (36.9 C)  TempSrc: Oral Oral Oral Oral  SpO2: 90% 97% 90% (!) 88%  Weight:      Height:        Intake/Output Summary (Last 24 hours) at 03/07/2018 0744 Last data filed at 03/07/2018 0865 Gross per 24 hour  Intake 420 ml  Output 500 ml    Net -80 ml   Filed Weights   03/02/18 2059 03/03/18 1431  Weight: 50.8 kg (112 lb) 50.8 kg (112 lb)    Examination:  General exam: Appears calm and comfortable Respiratory system: rales at bases, no wheezing. Cardiovascular system: Regular rate and rhythm. Normal S1 and S2. No heart murmurs present. No extra heart sounds Gastrointestinal system: Soft, non-tender, non-distended, no guarding, no rebound, no masses felt Central nervous system: Alert, oriented to person. Extremities: No edema or calf tenderness Skin: No cyanosis. No rashes    Data Reviewed: I have personally reviewed following labs and imaging studies  CBC: Recent Labs  Lab 03/02/18 2257 03/03/18 1536  WBC 12.0* 7.9  NEUTROABS 10.1*  --   HGB 12.4 12.5  HCT 37.6 37.9  MCV 100.8* 100.3*  PLT 186 179   Basic Metabolic Panel: Recent Labs  Lab 03/02/18 2257 03/03/18 1536  NA 141 143  K 3.8 3.8  CL 106 106  CO2 26 28  GLUCOSE 107* 101*  BUN 19 10  CREATININE 0.79 0.62  CALCIUM 10.5* 10.2   GFR: Estimated Creatinine Clearance: 41.2 mL/min (by C-G formula based on SCr of 0.62 mg/dL). Liver Function Tests: Recent Labs  Lab 03/03/18 1536  AST 19  ALT 12*  ALKPHOS 67  BILITOT 0.9  PROT 7.2  ALBUMIN 3.7   No results for input(s): LIPASE, AMYLASE in the last 168 hours. No results for input(s): AMMONIA in the last 168 hours. Coagulation Profile: No results for input(s): INR, PROTIME in the last 168  hours. Cardiac Enzymes: No results for input(s): CKTOTAL, CKMB, CKMBINDEX, TROPONINI in the last 168 hours. BNP (last 3 results) No results for input(s): PROBNP in the last 8760 hours. HbA1C: No results for input(s): HGBA1C in the last 72 hours. CBG: No results for input(s): GLUCAP in the last 168 hours. Lipid Profile: No results for input(s): CHOL, HDL, LDLCALC, TRIG, CHOLHDL, LDLDIRECT in the last 72 hours. Thyroid Function Tests: No results for input(s): TSH, T4TOTAL, FREET4, T3FREE, THYROIDAB  in the last 72 hours. Anemia Panel: No results for input(s): VITAMINB12, FOLATE, FERRITIN, TIBC, IRON, RETICCTPCT in the last 72 hours. Sepsis Labs: No results for input(s): PROCALCITON, LATICACIDVEN in the last 168 hours.  Recent Results (from the past 240 hour(s))  Urine culture     Status: Abnormal   Collection Time: 03/03/18 12:36 AM  Result Value Ref Range Status   Specimen Description   Final    URINE, CLEAN CATCH Performed at [facility], [address] [place], [place] [address]    Special Requests   Final    NONE Performed at [facility], [address] [place], [place] [address]    Culture (A)  Final    <10,000 COLONIES/mL INSIGNIFICANT GROWTH Performed at [facility], [address] [place], [place] [address]    Report Status 03/04/2018 FINAL  Final         Radiology Studies: Ct Hip Right Wo Contrast  Result Date: 03/05/2018 CLINICAL DATA:  Right hip pain to bear weight since a fall and inability 03/02/2018. Initial encounter. EXAM: CT OF THE RIGHT HIP WITHOUT CONTRAST TECHNIQUE: Multidetector CT imaging of the right hip was performed according to the standard protocol. Multiplanar CT image reconstructions were also generated. COMPARISON:  Plain films right hip 03/03/2017. FINDINGS: Bones/Joint/Cartilage No hip fracture or joint or other acute bony or joint abnormality is seen. 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 Moderate to moderately severe right hip osteoarthritis is seen with joint space narrowing and osteophytosis present. No right hip joint effusion. Ligaments Suboptimally assessed by CT. Muscles and Tendons Appear intact. Soft tissues Imaged intrapelvic contents demonstrate a small volume of free pelvic fluid. IMPRESSION: Negative for right hip fracture. No acute bony or joint abnormality is seen. Moderate to moderately severe right hip osteoarthritis. Small volume of free pelvic fluid. Cause of the fluid is not  identified. Electronically Signed   By: Drusilla Kannerhomas  Dalessio M.D.   On: 03/05/2018 13:11        Scheduled Meds: . enoxaparin (LOVENOX) injection  40 mg Subcutaneous Q24H  . ferrous sulfate  325 mg Oral Q breakfast  . memantine  5 mg Oral BID  . Vitamin D (Ergocalciferol)  50,000 Units Oral Q7 days   Continuous Infusions:    LOS: 1 day     Jacquelin Hawkingalph Margo Lama, MD [facility] 03/07/2018, 7:44 AM Pager: [phone]  If 7PM-7AM, please contact night-coverage [email or web address] Password [email or web address] 03/07/2018, 7:44 AM

## 2018-11-16 IMAGING — CT CT HIP*R* W/O CM
2 of 3 series · 17 of 46 positions shown, 19 images · non-contrast
Comparison: Plain films right hip 03/03/2017.

CLINICAL DATA: Right hip pain to bear weight since a fall and
inability 03/02/2018. Initial encounter.

EXAM:
CT OF THE RIGHT HIP WITHOUT CONTRAST
TECHNIQUE: Multidetector CT imaging of the right hip was performed according to
the standard protocol. Multiplanar CT image reconstructions were
also generated.

[Series 3: axial st · axial · 0.49mm/px · z∈[+1014,+1252]mm · 14 of 137 slices shown, 16 images]
[im 9/137  soft-tissue]
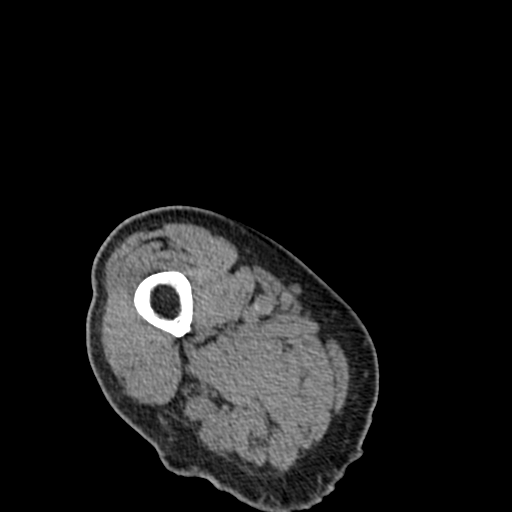
[im 9/137  bone]
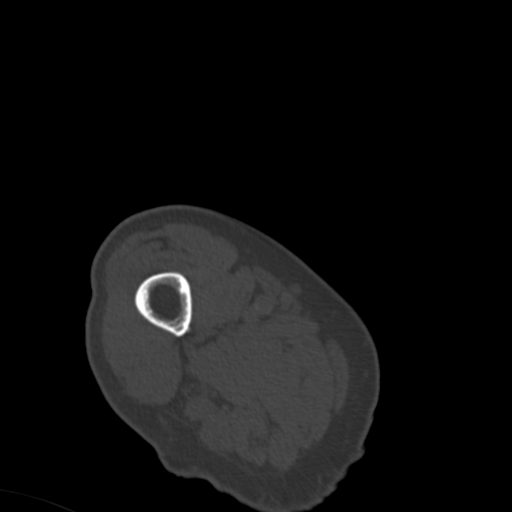
[im 18/137  soft-tissue]
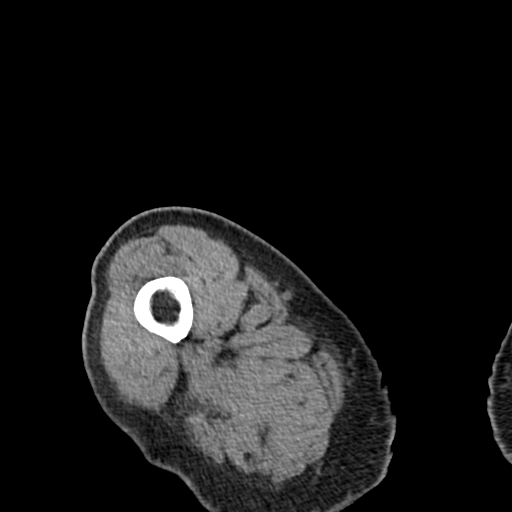
[im 27/137  soft-tissue]
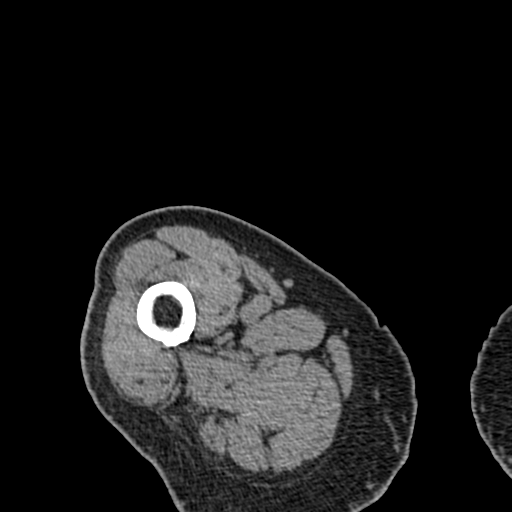
[im 36/137  soft-tissue]
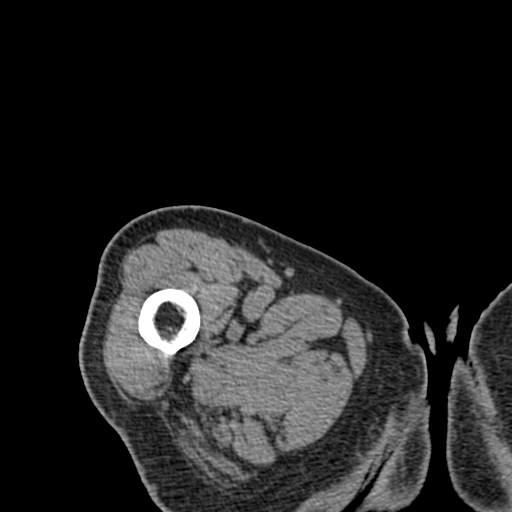
[im 44/137  soft-tissue]
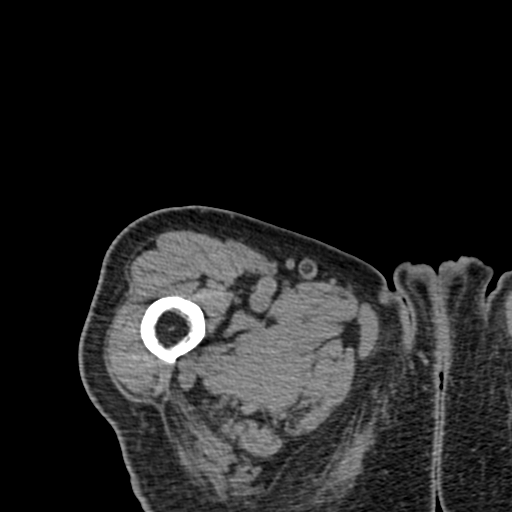
[im 53/137  soft-tissue]
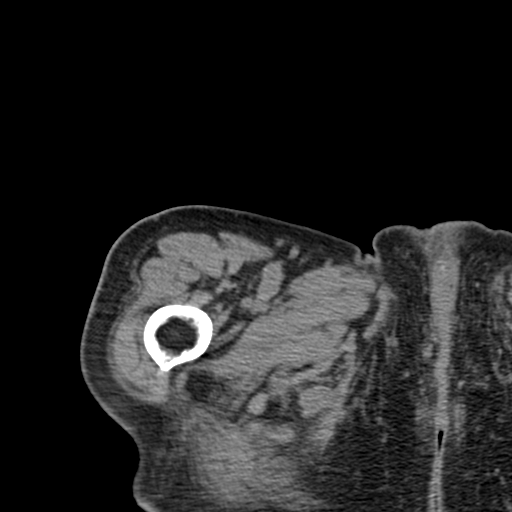
[im 62/137  soft-tissue]
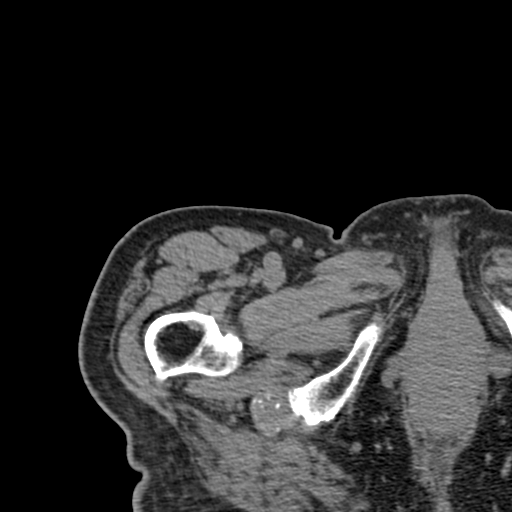
[im 75/137  soft-tissue]
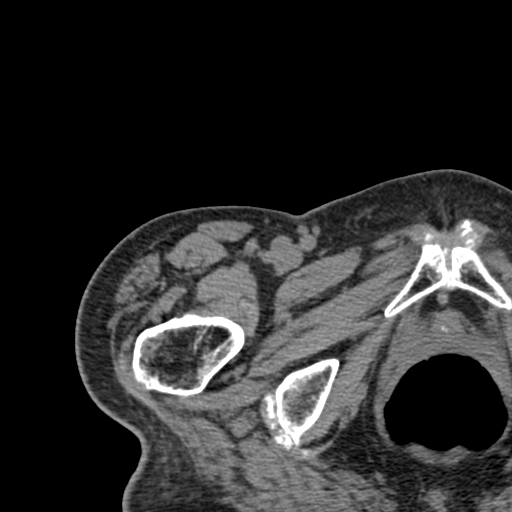
[im 84/137  soft-tissue]
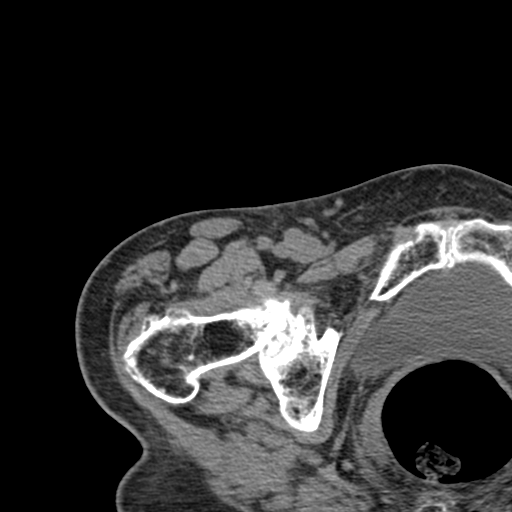
[im 84/137  bone]
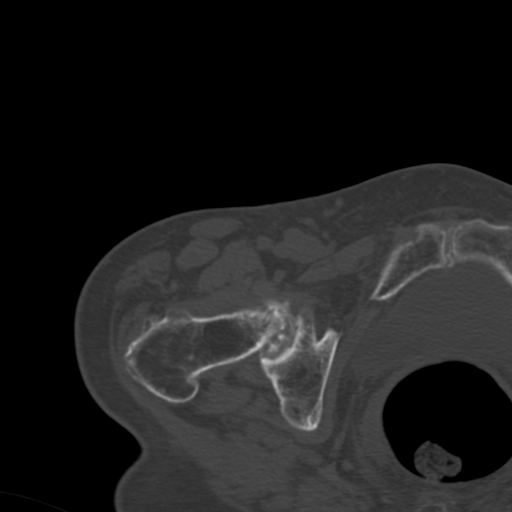
[im 93/137  soft-tissue]
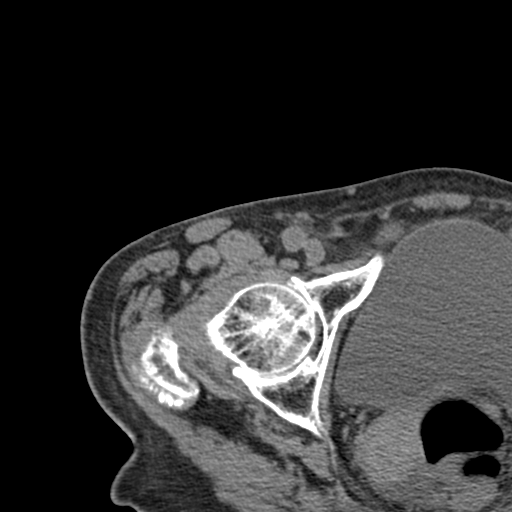
[im 101/137  soft-tissue]
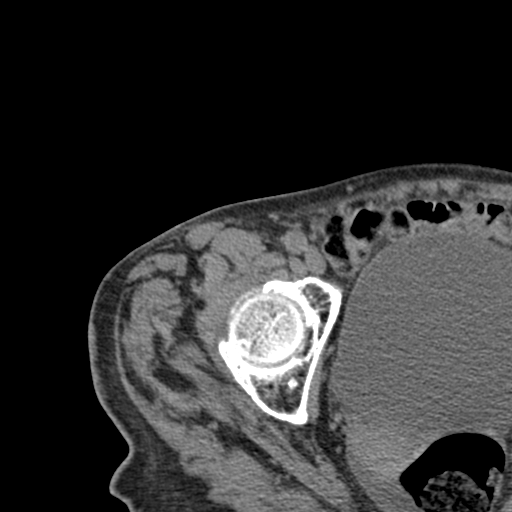
[im 110/137  soft-tissue]
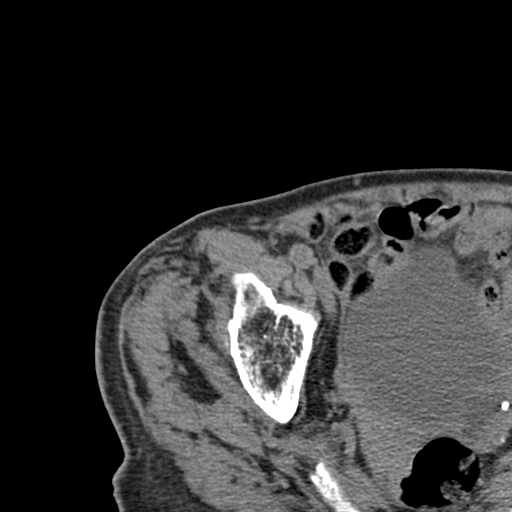
[im 119/137  soft-tissue]
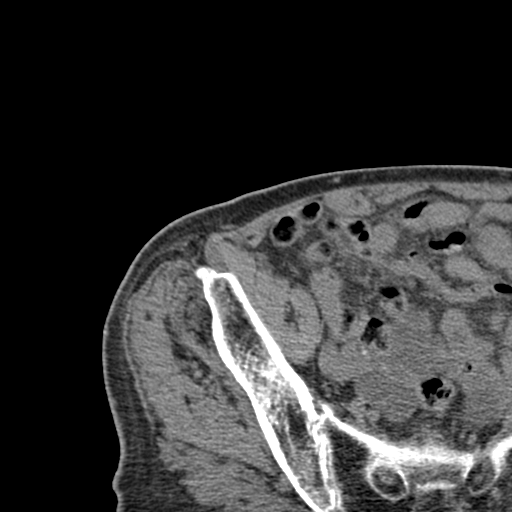
[im 128/137  soft-tissue]
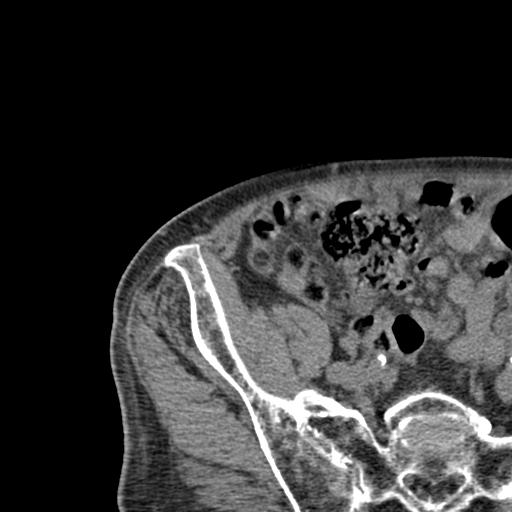

[Series 8: coronal st · coronal · 0.40mm/px · 3 of 119 slices shown]
[im 40/119  soft-tissue]
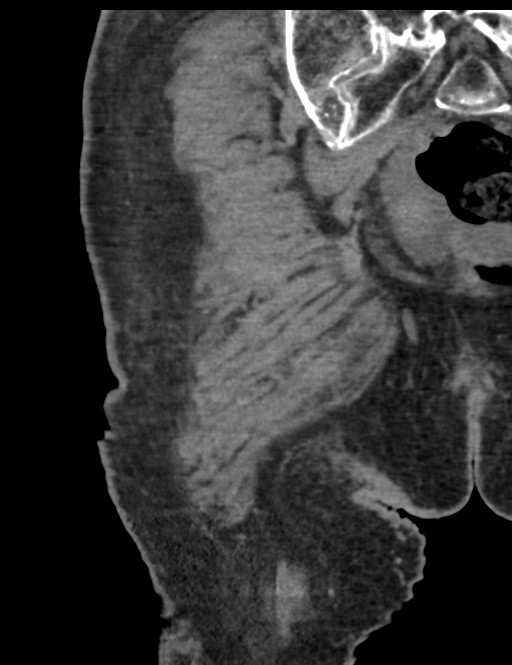
[im 53/119  soft-tissue]
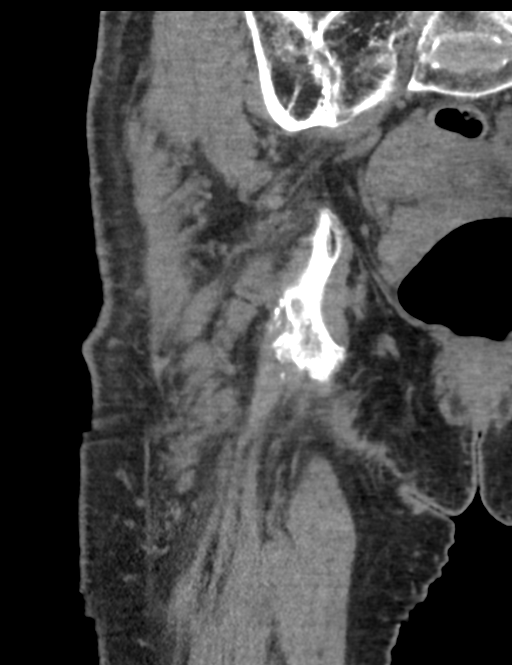
[im 66/119  soft-tissue]
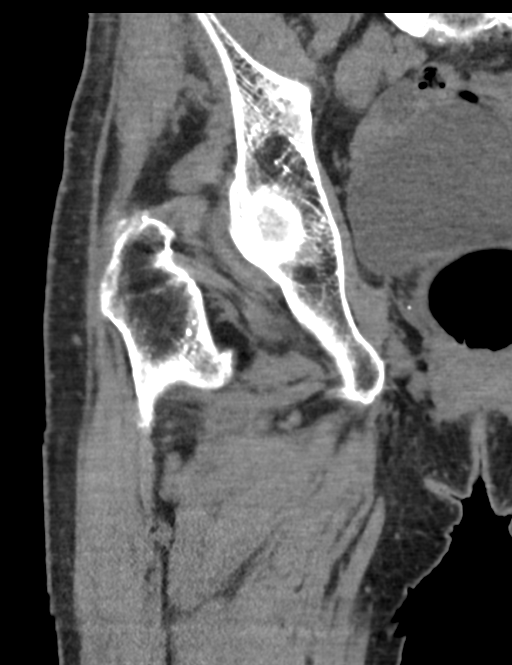

[17 of 46 positions shown; findings below may reference images not displayed]

FINDINGS: Bones/Joint/Cartilage

No hip fracture or joint or other acute bony or joint abnormality is
seen. The right SI joint is ankylosed. Moderate to moderately severe
right hip osteoarthritis is seen with joint space narrowing and
osteophytosis present. No right hip joint effusion.

Ligaments

Suboptimally assessed by CT.

Muscles and Tendons

Appear intact.

Soft tissues

Imaged intrapelvic contents demonstrate a small volume of free
pelvic fluid.
IMPRESSION: Negative for right hip fracture. No acute bony or joint abnormality
is seen.

Moderate to moderately severe right hip osteoarthritis.

Small volume of free pelvic fluid. Cause of the fluid is not
identified.

## 2018-12-17 DEATH — deceased
